# Patient Record
Sex: Male | Born: 1955 | Race: White | Hispanic: No | Marital: Married | Smoking: Never smoker
Health system: Southern US, Community
[De-identification: ages and names within clinical notes are randomized; demographics above are authoritative.]

## PROBLEM LIST (undated history)

## (undated) DIAGNOSIS — M5430 Sciatica, unspecified side: Secondary | ICD-10-CM

## (undated) DIAGNOSIS — E291 Testicular hypofunction: Secondary | ICD-10-CM

## (undated) DIAGNOSIS — U071 COVID-19: Secondary | ICD-10-CM

## (undated) DIAGNOSIS — R011 Cardiac murmur, unspecified: Secondary | ICD-10-CM

## (undated) DIAGNOSIS — Z6833 Body mass index (BMI) 33.0-33.9, adult: Secondary | ICD-10-CM

## (undated) DIAGNOSIS — F32A Depression, unspecified: Secondary | ICD-10-CM

## (undated) DIAGNOSIS — F419 Anxiety disorder, unspecified: Secondary | ICD-10-CM

## (undated) DIAGNOSIS — R0600 Dyspnea, unspecified: Secondary | ICD-10-CM

## (undated) DIAGNOSIS — J45909 Unspecified asthma, uncomplicated: Secondary | ICD-10-CM

## (undated) DIAGNOSIS — R0602 Shortness of breath: Secondary | ICD-10-CM

## (undated) DIAGNOSIS — E079 Disorder of thyroid, unspecified: Secondary | ICD-10-CM

## (undated) DIAGNOSIS — I10 Essential (primary) hypertension: Secondary | ICD-10-CM

## (undated) HISTORY — DX: Testicular hypofunction: E29.1

## (undated) HISTORY — DX: Sciatica, unspecified side: M54.30

## (undated) HISTORY — DX: Depression, unspecified: F32.A

## (undated) HISTORY — DX: Anxiety disorder, unspecified: F41.9

## (undated) HISTORY — DX: Essential (primary) hypertension: I10

## (undated) HISTORY — DX: COVID-19: U07.1

## (undated) HISTORY — DX: Dyspnea, unspecified: R06.00

## (undated) HISTORY — DX: Shortness of breath: R06.02

## (undated) HISTORY — DX: Cardiac murmur, unspecified: R01.1

## (undated) HISTORY — DX: Body mass index (BMI) 33.0-33.9, adult: Z68.33

---

## 2004-12-21 ENCOUNTER — Encounter: Admission: RE | Admit: 2004-12-21 | Discharge: 2004-12-21 | Payer: Self-pay | Admitting: Allergy and Immunology

## 2009-05-27 ENCOUNTER — Ambulatory Visit: Payer: Self-pay | Admitting: Diagnostic Radiology

## 2009-05-27 ENCOUNTER — Emergency Department (HOSPITAL_BASED_OUTPATIENT_CLINIC_OR_DEPARTMENT_OTHER): Admission: EM | Admit: 2009-05-27 | Discharge: 2009-05-27 | Payer: Self-pay | Admitting: Emergency Medicine

## 2017-07-21 ENCOUNTER — Other Ambulatory Visit (HOSPITAL_BASED_OUTPATIENT_CLINIC_OR_DEPARTMENT_OTHER): Payer: Self-pay | Admitting: Family Medicine

## 2017-07-21 DIAGNOSIS — M5430 Sciatica, unspecified side: Secondary | ICD-10-CM

## 2017-07-22 ENCOUNTER — Ambulatory Visit (HOSPITAL_BASED_OUTPATIENT_CLINIC_OR_DEPARTMENT_OTHER)
Admission: RE | Admit: 2017-07-22 | Discharge: 2017-07-22 | Disposition: A | Discharge status: Home / Self Care | Payer: 59 | Source: Ambulatory Visit | Attending: Family Medicine | Admitting: Family Medicine

## 2017-07-22 DIAGNOSIS — R937 Abnormal findings on diagnostic imaging of other parts of musculoskeletal system: Secondary | ICD-10-CM | POA: Insufficient documentation

## 2017-07-22 DIAGNOSIS — M5430 Sciatica, unspecified side: Secondary | ICD-10-CM | POA: Diagnosis present

## 2018-08-01 IMAGING — MR MR LUMBAR SPINE W/O CM
7 series · 46 of 48 positions shown · non-contrast
Comparison: None.

CLINICAL DATA: Low back pain for 3 years. RIGHT foot pain and
numbness for 1 month.

EXAM:
MRI LUMBAR SPINE WITHOUT CONTRAST
TECHNIQUE: Multiplanar, multisequence MR imaging of the lumbar spine was
performed. No intravenous contrast was administered.

[Series 2: T1 · sagittal · 4.0mm · 0.81mm/px · 5 of 16 slices shown (1 of 3)]
[im 1/16]
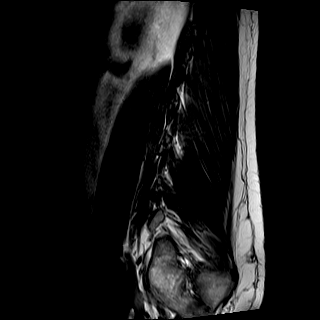
[im 4/16]
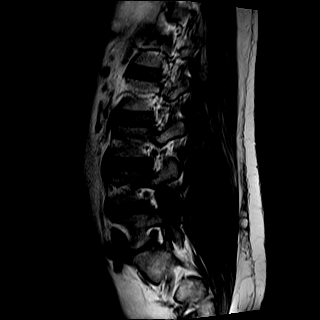
[im 8/16]
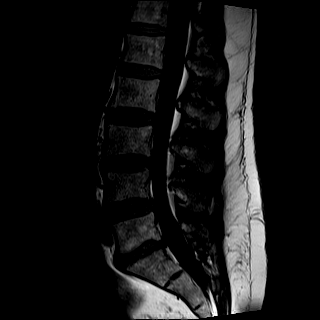
[im 12/16]
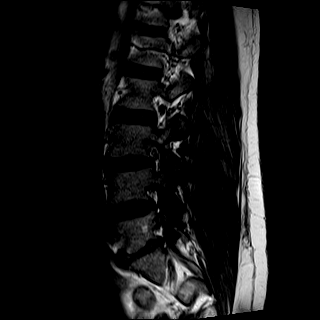
[im 16/16]
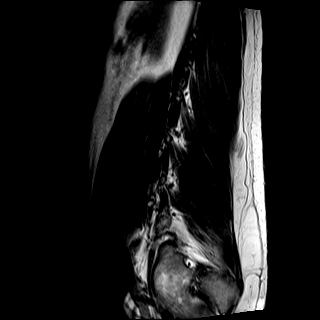

[Series 3: T2 · sagittal · 4.0mm · 0.81mm/px · 5 of 16 slices shown (1 of 3)]
[im 1/16]
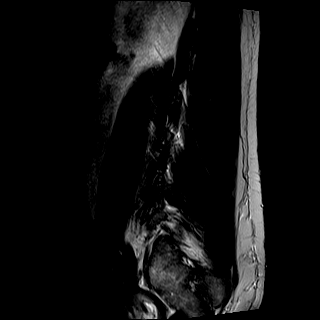
[im 4/16]
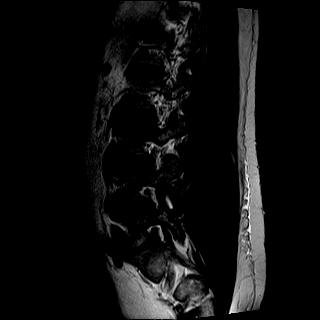
[im 8/16]
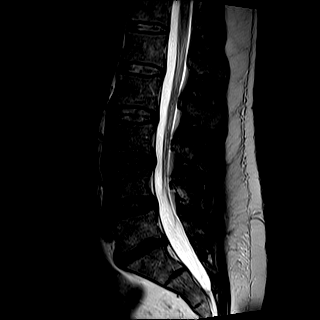
[im 12/16]
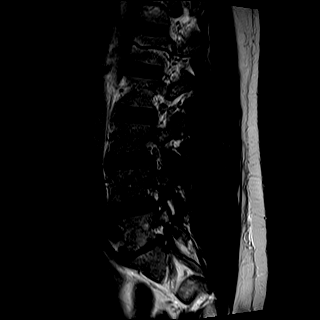
[im 16/16]
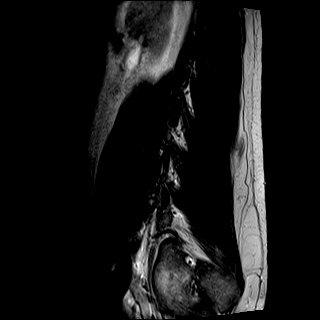

[Series 4: STIR · sagittal · 4.0mm · 0.81mm/px · 4 of 16 slices shown]
[im 1/16]
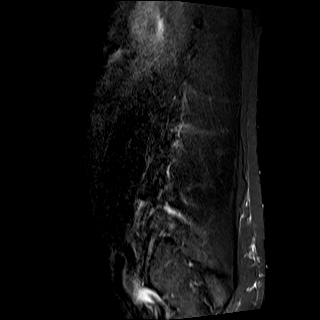
[im 4/16]
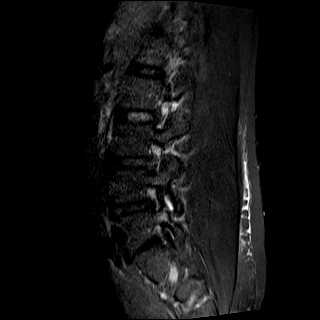
[im 7/16]
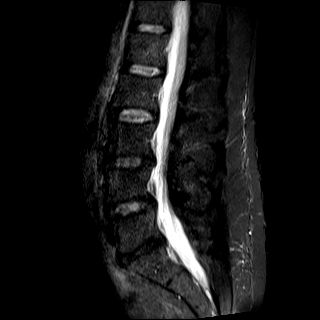
[im 10/16]
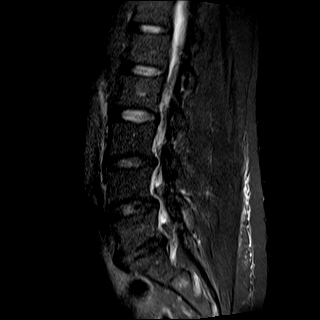

[Series 5: T2 · axial · 4.0mm · 0.62mm/px · z∈[+53,+157]mm · 8 of 22 slices shown (2 of 3)]
[im 1/22]
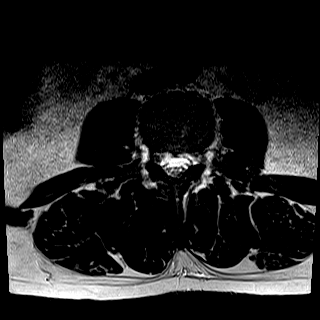
[im 4/22]
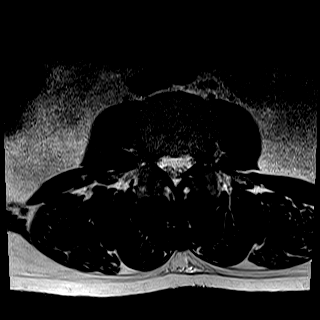
[im 7/22]
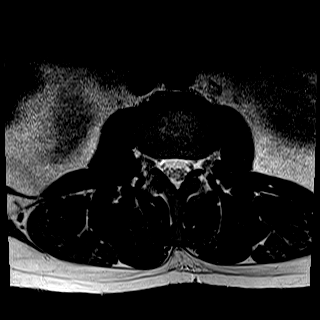
[im 10/22]
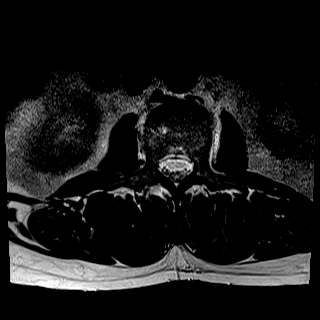
[im 13/22]
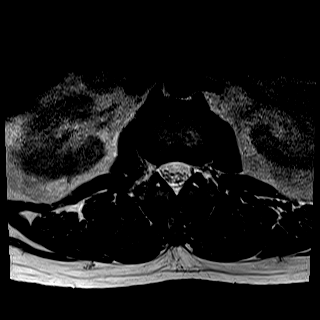
[im 16/22]
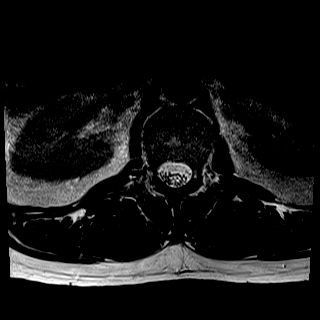
[im 19/22]
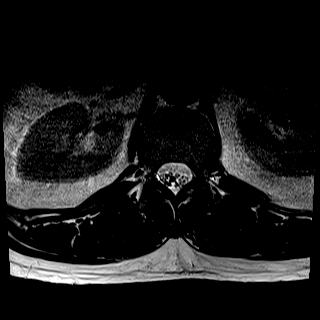
[im 22/22]
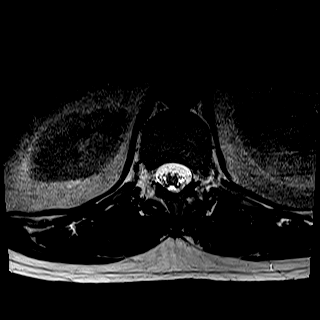

[Series 8: T1 · axial · 4.0mm · 0.78mm/px · z∈[-73,+30]mm · 8 of 22 slices shown (2 of 3)]
[im 1/22]
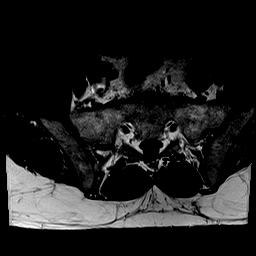
[im 4/22]
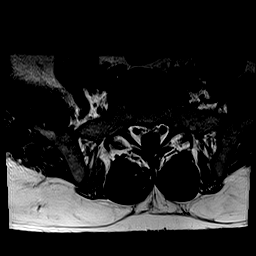
[im 7/22]
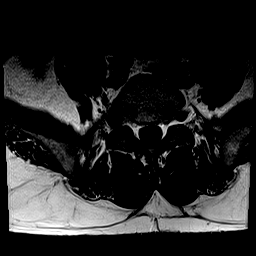
[im 10/22]
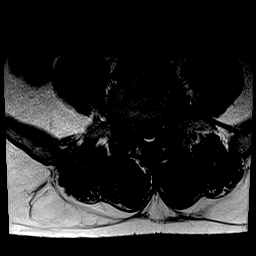
[im 13/22]
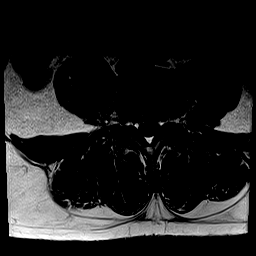
[im 16/22]
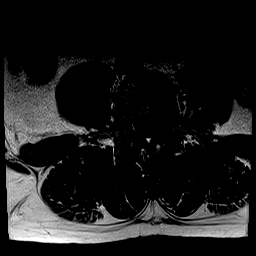
[im 19/22]
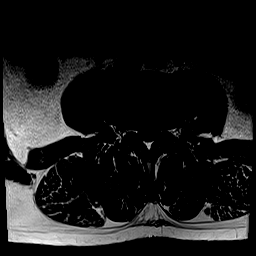
[im 22/22]
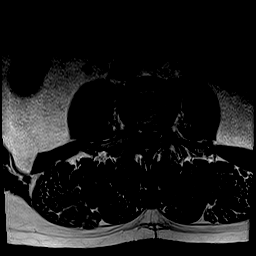

[Series 9: T2 · axial · 4.0mm · 0.62mm/px · z∈[-73,+30]mm · 8 of 22 slices shown (3 of 3)]
[im 1/22]
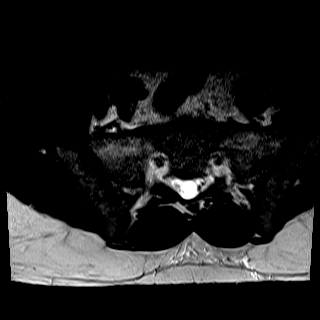
[im 4/22]
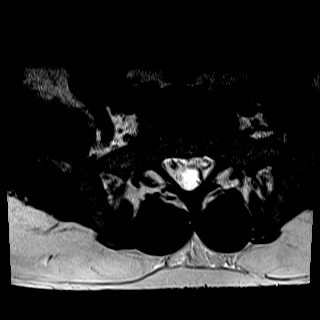
[im 7/22]
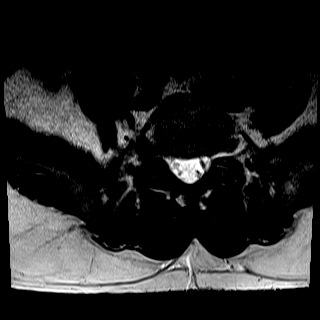
[im 10/22]
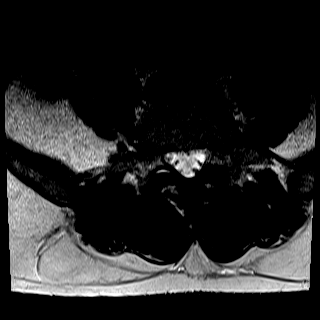
[im 13/22]
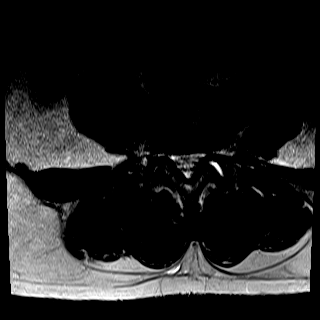
[im 16/22]
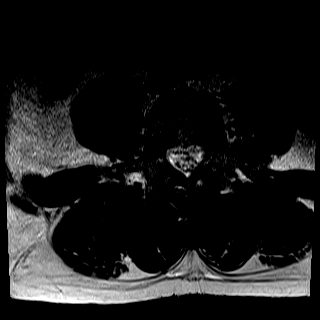
[im 19/22]
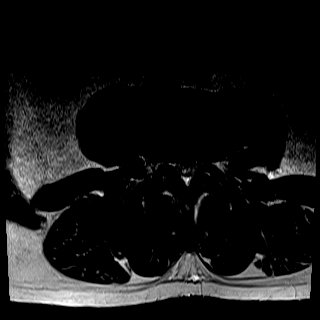
[im 22/22]
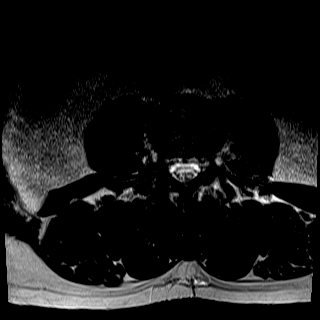

[Series 10: T1 · axial · 4.0mm · 0.62mm/px · z∈[+53,+157]mm · 8 of 22 slices shown (3 of 3)]
[im 1/22]
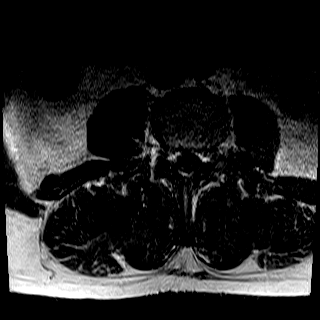
[im 4/22]
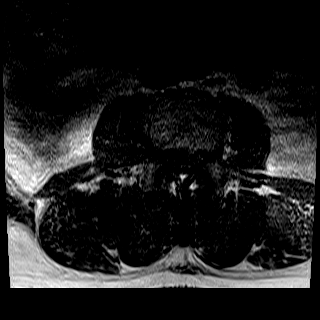
[im 7/22]
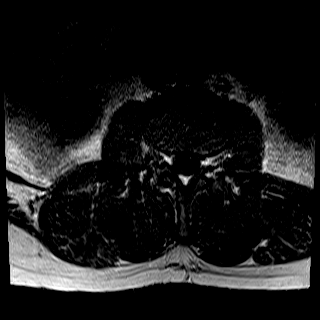
[im 10/22]
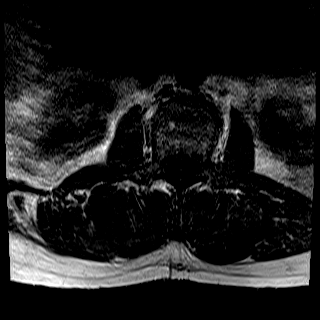
[im 13/22]
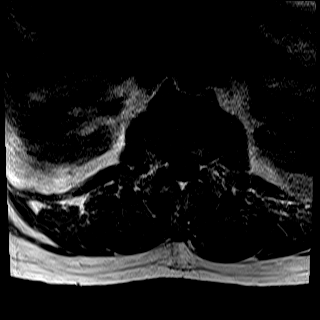
[im 16/22]
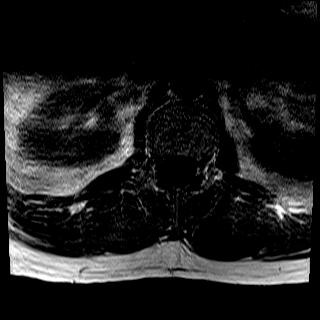
[im 19/22]
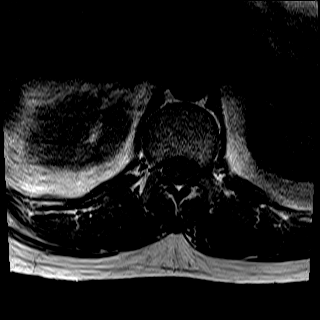
[im 22/22]
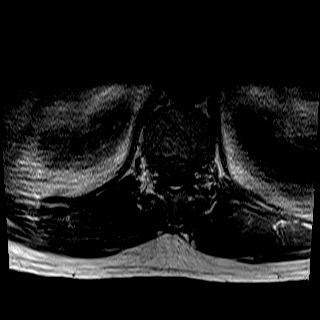

[46 of 48 positions shown; findings below may reference images not displayed]

FINDINGS: SEGMENTATION: For the purposes of this report, the last well-formed
intervertebral disc is reported as L5-S1.

ALIGNMENT: Maintained lumbar lordosis. Minimal grade 1 L4-5
anterolisthesis.

VERTEBRAE:Vertebral bodies are intact. Moderate L5-S1 disc height
loss. Mild desiccation L3-4 through L5-S1 discs. Mild chronic
discogenic endplate changes L5-S1, acute component toward the RIGHT.
Minimal chronic discogenic endplate changes L3-4 and L4-5.

CONUS MEDULLARIS AND CAUDA EQUINA: Conus medullaris terminates at
T12-L1 and demonstrates normal morphology and signal
characteristics. Cauda equina is normal.

PARASPINAL AND OTHER SOFT TISSUES: Included prevertebral and
paraspinal soft tissues are normal.

DISC LEVELS (moderately motion degraded axial sequences):

T12-L1 through L2-3: No disc bulge, canal stenosis nor neural
foraminal narrowing.

L3-4: Small broad-based disc bulge, mild facet arthropathy and
ligamentum flavum redundancy. No canal stenosis. Mild to moderate
RIGHT, mild LEFT neural foraminal narrowing.

L4-5: Anterolisthesis. Small broad-based disc bulge. Moderate facet
arthropathy with facet effusions which are likely reactive. No canal
stenosis. Mild to moderate bilateral neural foraminal narrowing.

L5-S1: Small broad-based disc bulge asymmetric to the RIGHT
extraforaminal low signal (axial 16/22). Mild facet arthropathy and
ligamentum flavum redundancy without canal stenosis. Moderate RIGHT,
mild LEFT neural foraminal narrowing.
IMPRESSION: 1. Motion degraded examination.
2. Minimal grade 1 L4-5 anterolisthesis without spondylolysis. No
fracture.
3. L5-S1 RIGHT extraforaminal disc protrusion/extrusion versus
partial volume main artifact.
4. No canal stenosis. Neural foraminal narrowing L3-4 through L5-S1:
Moderate on the RIGHT at L5-S1.

## 2020-06-17 ENCOUNTER — Emergency Department (HOSPITAL_BASED_OUTPATIENT_CLINIC_OR_DEPARTMENT_OTHER)
Admission: EM | Admit: 2020-06-17 | Discharge: 2020-06-17 | Disposition: A | Discharge status: Home / Self Care | Payer: 59 | Attending: Emergency Medicine | Admitting: Emergency Medicine

## 2020-06-17 ENCOUNTER — Encounter (HOSPITAL_BASED_OUTPATIENT_CLINIC_OR_DEPARTMENT_OTHER): Payer: Self-pay | Admitting: *Deleted

## 2020-06-17 ENCOUNTER — Other Ambulatory Visit: Payer: Self-pay

## 2020-06-17 DIAGNOSIS — U071 COVID-19: Secondary | ICD-10-CM | POA: Insufficient documentation

## 2020-06-17 DIAGNOSIS — R0602 Shortness of breath: Secondary | ICD-10-CM | POA: Diagnosis present

## 2020-06-17 DIAGNOSIS — R5381 Other malaise: Secondary | ICD-10-CM | POA: Diagnosis not present

## 2020-06-17 DIAGNOSIS — J45909 Unspecified asthma, uncomplicated: Secondary | ICD-10-CM | POA: Diagnosis not present

## 2020-06-17 DIAGNOSIS — R410 Disorientation, unspecified: Secondary | ICD-10-CM | POA: Insufficient documentation

## 2020-06-17 DIAGNOSIS — R531 Weakness: Secondary | ICD-10-CM | POA: Diagnosis not present

## 2020-06-17 DIAGNOSIS — R5383 Other fatigue: Secondary | ICD-10-CM

## 2020-06-17 DIAGNOSIS — R0600 Dyspnea, unspecified: Secondary | ICD-10-CM | POA: Insufficient documentation

## 2020-06-17 HISTORY — DX: Disorder of thyroid, unspecified: E07.9

## 2020-06-17 HISTORY — DX: Unspecified asthma, uncomplicated: J45.909

## 2020-06-17 LAB — BASIC METABOLIC PANEL
Anion gap: 8 (ref 5–15)
BUN: 13 mg/dL (ref 8–23)
CO2: 27 mmol/L (ref 22–32)
Calcium: 8.2 mg/dL — ABNORMAL LOW (ref 8.9–10.3)
Chloride: 102 mmol/L (ref 98–111)
Creatinine, Ser: 0.84 mg/dL (ref 0.61–1.24)
GFR, Estimated: >60 mL/min (ref >60)
Glucose, Bld: 106 mg/dL — ABNORMAL HIGH (ref 70–99)
Potassium: 3.9 mmol/L (ref 3.5–5.1)
Sodium: 137 mmol/L (ref 135–145)

## 2020-06-17 LAB — CBC WITH DIFFERENTIAL/PLATELET
Abs Immature Granulocytes: 0.06 10*3/uL (ref 0.00–0.07)
Basophils Absolute: 0 10*3/uL (ref 0.0–0.1)
Basophils Relative: 0 %
Eosinophils Absolute: 0 10*3/uL (ref 0.0–0.5)
Eosinophils Relative: 0 %
HCT: 45.1 % (ref 39.0–52.0)
Hemoglobin: 15.1 g/dL (ref 13.0–17.0)
Immature Granulocytes: 1 %
Lymphocytes Relative: 9 %
Lymphs Abs: 0.6 10*3/uL — ABNORMAL LOW (ref 0.7–4.0)
MCH: 31.6 pg (ref 26.0–34.0)
MCHC: 33.5 g/dL (ref 30.0–36.0)
MCV: 94.4 fL (ref 80.0–100.0)
Monocytes Absolute: 0.7 10*3/uL (ref 0.1–1.0)
Monocytes Relative: 10 %
Neutro Abs: 5.7 10*3/uL (ref 1.7–7.7)
Neutrophils Relative %: 80 %
Platelets: 238 10*3/uL (ref 150–400)
RBC: 4.78 MIL/uL (ref 4.22–5.81)
RDW: 12.5 % (ref 11.5–15.5)
WBC: 7.2 10*3/uL (ref 4.0–10.5)
nRBC: 0 % (ref 0.0–0.2)

## 2020-06-17 MED ORDER — SODIUM CHLORIDE 0.9 % IV BOLUS
500.0000 mL | Freq: Once | INTRAVENOUS | Status: AC
  Administered 2020-06-17: 500 mL via INTRAVENOUS

## 2020-06-17 NOTE — ED Provider Notes (Signed)
[facility] EMERGENCY DEPARTMENT Provider Note   CSN: [id number] Arrival date & time: 06/17/20  1026     History Chief Complaint  Patient presents with  . Covid+, SOB    Bobby Morrow is a 64 y.o. male.  Patient presents ER chief complaint of persistent shortness of breath with some generalized malaise and weakness, persistent episodes of confusion which he describes as losing his train of thought.  He was diagnosed with Covid about 11 days ago.  He states that his cough has improved and is not having any fevers but is concerned that he still having symptoms.  Denies any pain at this time but complaining of generalized weakness and fatigue.       Past Medical History:  Diagnosis Date  . Asthma   . Thyroid disease     There are no problems to display for this patient.   History reviewed. No pertinent surgical history.     History reviewed. No pertinent family history.  Social History   Tobacco Use  . Smoking status: Never Smoker  . Smokeless tobacco: Never Used  Substance Use Topics  . Alcohol use: Yes    Comment: occasionally  . Drug use: Never    Home Medications Prior to Admission medications   Not on File    Allergies    Ciprofloxacin and Sulfa antibiotics  Review of Systems   Review of Systems  Constitutional: Negative for fever.  HENT: Negative for ear pain and sore throat.   Eyes: Negative for pain.  Respiratory: Positive for shortness of breath. Negative for cough.   Cardiovascular: Negative for chest pain.  Gastrointestinal: Negative for abdominal pain.  Genitourinary: Negative for flank pain.  Musculoskeletal: Negative for back pain.  Skin: Negative for color change and rash.  Neurological: Negative for syncope.  All other systems reviewed and are negative.   Physical Exam Updated Vital Signs BP (!) 157/115   Pulse 95   Temp 98.2 F (36.8 C) (Oral)   Resp (!) 23   Ht 6\' 2"  (1.88 m)   Wt 103 kg   SpO2 93%   BMI 29.15  kg/m   Physical Exam Constitutional:      General: He is not in acute distress.    Appearance: He is well-developed.  HENT:     Head: Normocephalic.     Mouth/Throat:     Mouth: Mucous membranes are moist.  Cardiovascular:     Rate and Rhythm: Normal rate.  Pulmonary:     Effort: Pulmonary effort is normal.  Abdominal:     Palpations: Abdomen is soft.  Musculoskeletal:     Right lower leg: No edema.     Left lower leg: No edema.  Skin:    General: Skin is warm.     Capillary Refill: Capillary refill takes less than 2 seconds.  Neurological:     General: No focal deficit present.     Mental Status: He is alert.     ED Results / Procedures / Treatments   Labs (all labs ordered are listed, but only abnormal results are displayed) Labs Reviewed  BASIC METABOLIC PANEL - Abnormal; Notable for the following components:      Result Value   Glucose, Bld 106 (*)    Calcium 8.2 (*)    All other components within normal limits  CBC WITH DIFFERENTIAL/PLATELET - Abnormal; Notable for the following components:   Lymphs Abs 0.6 (*)    All other components within normal limits  EKG None  Radiology No results found.  Procedures Procedures (including critical care time)  Medications Ordered in ED Medications  sodium chloride 0.9 % bolus 500 mL (500 mLs Intravenous New Bag/Given 06/17/20 1148)    ED Course  I have reviewed the triage vital signs and the nursing notes.  Pertinent labs & imaging results that were available during my care of the patient were reviewed by me and considered in my medical decision making (see chart for details).    MDM Rules/Calculators/A&P                          Presents with persistent Covid symptoms despite nearing the end of his Covid infection.  States that he still gets episodes where he loses train of thought any brain feels confused at times.  Labs are sent and chemistry appears normal vital signs within normal limits.  O2  saturation 97% on room air which is appropriate.  Patient advised that sometimes certain Covid symptoms can linger for weeks to months.  Recommended close outpatient follow-up and monitoring with his primary care doctor within the next 2 to 3 days.  I advised immediate return if he has worsening symptoms pain.   Final Clinical Impression(s) / ED Diagnoses Final diagnoses:  Fatigue, unspecified type  Dyspnea, unspecified type    Rx / DC Orders ED Discharge Orders    None       Cheryll Cockayne, MD 06/17/20 1317

## 2020-06-17 NOTE — ED Triage Notes (Signed)
Tested Covid + on the 28th of November and still has shortness of breath.  Pt stated that he has some confusion since he had the covid.

## 2020-06-17 NOTE — Discharge Instructions (Addendum)
Call your primary care doctor or specialist as discussed in the next 2-3 days.   Return immediately back to the ER if:  Your symptoms worsen within the next 12-24 hours. You develop new symptoms such as new fevers, persistent vomiting, new pain, shortness of breath, or new weakness or numbness, or if you have any other concerns.  

## 2020-06-17 NOTE — ED Notes (Signed)
Pt on monitor and vitals cycling 

## 2022-05-08 NOTE — Progress Notes (Unsigned)
Cardiology Office Note:    Date:  05/08/2022   ID:  Bobby Morrow, DOB 1955-07-29, MRN [id number]  PCP:  System, Provider Not In   [place] Providers Cardiologist:  None {    Referring MD: Ramiro Harvest, PA-C    History of Present Illness:    Bobby Morrow is a 66 y.o. male with a hx of anxiety, depression, HTN and asthma who was referred by Ammie Dalton, PA-C for further evaluation of dyspnea on exertion.  Patient was seen on 04/12/22 by Ammie Dalton. Note reviewed. Patient was having dyspnea on exertion at that visit. TTE per their report showed EF 55-60%, no significant valve disease, G1DD. Given symptoms, he was referred to Cardiology for further evaluation.  Today, ***  Past Medical History:  Diagnosis Date   Anxiety    Asthma    BMI 33.0-33.9,adult    COVID    Depression    Dyspnea    Heart murmur    Hypertension    Sciatic pain    SOB (shortness of breath)    Testosterone deficiency in male    Thyroid disease     No past surgical history on file.  Current Medications: No outpatient medications have been marked as taking for the 05/10/22 encounter (Appointment) with Freada Bergeron, MD.     Allergies:   Ciprofloxacin, Amlodipine, and Sulfa antibiotics   Social History   Socioeconomic History   Marital status: Married    Spouse name: Not on file   Number of children: Not on file   Years of education: Not on file   Highest education level: Not on file  Occupational History   Not on file  Tobacco Use   Smoking status: Never   Smokeless tobacco: Never  Substance and Sexual Activity   Alcohol use: Yes    Comment: occasionally   Drug use: Never   Sexual activity: Not Currently  Other Topics Concern   Not on file  Social History Narrative   Not on file   Social Determinants of Health   Financial Resource Strain: Not on file  Food Insecurity: Not on file  Transportation Needs: Not on file  Physical Activity: Not on  file  Stress: Not on file  Social Connections: Not on file     Family History: The patient's ***family history is not on file.  ROS:   Please see the history of present illness.    *** All other systems reviewed and are negative.  EKGs/Labs/Other Studies Reviewed:    The following studies were reviewed today: ***  EKG:  EKG is *** ordered today.  The ekg ordered today demonstrates ***  Recent Labs: No results found for requested labs within last 365 days.  Recent Lipid Panel No results found for: "CHOL", "TRIG", "HDL", "CHOLHDL", "VLDL", "LDLCALC", "LDLDIRECT"   Risk Assessment/Calculations:   {Does this patient have ATRIAL FIBRILLATION?[phone]}  No BP recorded.  {Refresh Note OR Click here to enter BP  :1}***         Physical Exam:    VS:  There were no vitals taken for this visit.    Wt Readings from Last 3 Encounters:  06/17/20 227 lb (103 kg)     GEN: *** Well nourished, well developed in no acute distress HEENT: Normal NECK: No JVD; No carotid bruits LYMPHATICS: No lymphadenopathy CARDIAC: ***RRR, no murmurs, rubs, gallops RESPIRATORY:  Clear to auscultation without rales, wheezing or rhonchi  ABDOMEN: Soft, non-tender, non-distended MUSCULOSKELETAL:  No  edema; No deformity  SKIN: Warm and dry NEUROLOGIC:  Alert and oriented x 3 PSYCHIATRIC:  Normal affect   ASSESSMENT:    No diagnosis found. PLAN:    In order of problems listed above:  #Dyspnea on Exertion: TTE with LVEF 55-60%, no significant valve disease, G1DD. Currently, *** -Check coronary CTA  #HTN: -Continue losartan 100mg  daily      {Are you ordering a CV Procedure (e.g. stress test, cath, DCCV, TEE, etc)?   Press F2        :    Medication Adjustments/Labs and Tests Ordered: Current medicines are reviewed at length with the patient today.  Concerns regarding medicines are outlined above.  No orders of the defined types were placed in this encounter.  No orders of  the defined types were placed in this encounter.   There are no Patient Instructions on file for this visit.   Signed, [id number]}, MD  05/08/2022 8:24 PM    [facility]

## 2022-05-10 ENCOUNTER — Ambulatory Visit: Payer: 59 | Attending: Cardiology | Admitting: Cardiology

## 2022-05-10 ENCOUNTER — Encounter: Payer: Self-pay | Admitting: Cardiology

## 2022-05-10 VITALS — BP 142/99 | HR 83 | Ht 74.0 in | Wt 233.0 lb

## 2022-05-10 DIAGNOSIS — R072 Precordial pain: Secondary | ICD-10-CM

## 2022-05-10 DIAGNOSIS — R0609 Other forms of dyspnea: Secondary | ICD-10-CM | POA: Diagnosis not present

## 2022-05-10 DIAGNOSIS — I10 Essential (primary) hypertension: Secondary | ICD-10-CM

## 2022-05-10 MED ORDER — VALSARTAN 160 MG PO TABS: 160.0000 mg | ORAL_TABLET | Freq: Every day | ORAL | 2 refills | Status: DC

## 2022-05-10 MED ORDER — METOPROLOL TARTRATE 100 MG PO TABS: 100.0000 mg | ORAL_TABLET | Freq: Once | ORAL | 0 refills | Status: DC

## 2022-05-10 NOTE — Patient Instructions (Signed)
Medication Instructions:   STOP LOSARTAN NOW  START TAKING VALSARTAN 160 MG BY MOUTH DAILY  *If you need a refill on your cardiac medications before your next appointment, please call your pharmacy*   Lab Work:  ONE WEEK HERE IN THE OFFICE--BMET  If you have labs (blood work) drawn today and your tests are completely normal, you will receive your results only by: MyChart Message (if you have MyChart) OR A paper copy in the mail If you have any lab test that is abnormal or we need to change your treatment, we will call you to review the results.   Testing/Procedures:    Your cardiac CT will be scheduled at one of the below locations:   [facility] [address] [place], [place] [address] [phone]   If scheduled at [facility], please arrive at the [facility] (Entrance C2) of [facility] 30 minutes prior to test start time. You can use the FREE valet parking offered at entrance C (encouraged to control the heart rate for the test)  Proceed to the [facility] Radiology Department (first floor) to check-in and test prep.  All radiology patients and guests should use entrance C2 at [facility], accessed from [facility], even though the hospital's physical address listed is [address]     Please follow these instructions carefully (unless otherwise directed):  Hold all erectile dysfunction medications at least 3 days (72 hrs) prior to test. (Ie viagra, cialis, sildenafil, tadalafil, etc) We will administer nitroglycerin during this exam.   On the Night Before the Test: Be sure to Drink plenty of water. Do not consume any caffeinated/decaffeinated beverages or chocolate 12 hours prior to your test. Do not take any antihistamines 12 hours prior to your test.   On the Day of the Test: Drink plenty of water until 1 hour prior to the test. Do not eat any food 1 hour prior to test. You  may take your regular medications prior to the test.  Take metoprolol 100 MG BY MOUTH  (Lopressor) two hours prior to test.       After the Test: Drink plenty of water. After receiving IV contrast, you may experience a mild flushed feeling. This is normal. On occasion, you may experience a mild rash up to 24 hours after the test. This is not dangerous. If this occurs, you can take Benadryl 25 mg and increase your fluid intake. If you experience trouble breathing, this can be serious. If it is severe call 911 IMMEDIATELY. If it is mild, please call our office.   We will call to schedule your test 2-4 weeks out understanding that some insurance companies will need an authorization prior to the service being performed.   For non-scheduling related questions, please contact the cardiac imaging nurse navigator should you have any questions/concerns: Rockwell Alexandria, Cardiac Imaging Nurse Navigator Larey Brick, Cardiac Imaging Nurse Navigator [facility] Direct Office Dial: [phone]   For scheduling needs, including cancellations and rescheduling, please call [place], [phone].    Follow-Up: At [facility], you and your health needs are our priority.  As part of our continuing mission to provide you with exceptional heart care, we have created designated Provider Care Teams.  These Care Teams include your primary Cardiologist (physician) and Advanced Practice Providers (APPs -  Physician Assistants and Nurse Practitioners) who all work together to provide you with the care you need, when  you need it.  We recommend signing up for the patient portal called "MyChart".  Sign up information is provided on this After Visit Summary.  MyChart is used to connect with patients for Virtual Visits (Telemedicine).  Patients are able to view lab/test results, encounter notes, upcoming appointments, etc.  Non-urgent messages can be sent to your provider as well.    To learn more about what you can do with MyChart, go to [email or web address].    Your next appointment:   1 year(s)  The format for your next appointment:   In Person  Provider:   DR. Johney Frame   Important Information About Sugar

## 2022-05-10 NOTE — Progress Notes (Signed)
Cardiology Office Note:    Date:  05/10/2022   ID:  Bobby Morrow, DOB [birth year], MRN [id number]  PCP:  System, Provider Not In   [facility] Providers Cardiologist:  None {    Referring MD: Sheliah Hatch, PA-C    History of Present Illness:    Bobby Morrow is a 66 y.o. male with a hx of anxiety, depression, HTN and asthma who was referred by Tera Helper, PA-C for further evaluation of dyspnea on exertion.  Patient was seen on 04/12/22 by Tera Helper. Note reviewed. Patient was having dyspnea on exertion at that visit. TTE per their report showed EF 55-60%, no significant valve disease, G1DD. Given symptoms, he was referred to Cardiology for further evaluation.   Today, the patient states that he has been feeling ok. He reports new onset shortness of breath. He walks to the mailbox and back around 75 feet and has to sit down and rest right after. This is new to him and started a few months ago, but has been worsening recently. He doesn't notice any related symptoms such as palpitations or chest pain.  His blood pressure at home has been around 130-140s and he states that is has been fluctuating with his caffeine consumption and activity. He is rarely in the 120's. His blood pressure in clinic was measured at 142/99.  He rides his exercise bike every day for 20 minutes. In the past he was able to go for an hour or more. Recently he hasn't been able to bike longer than 20 minutes and has to rest for a long time after.  He used to drink 6 diet mountain dews a day and now instead he takes caffeine mints which limit him to 150 mg of caffeine a day. He states that without caffeine he feels fatigued.  He reported lightheadedness and dizziness when he used amlodipine and doesn't think he can tolerate the medication.  He has been compliant with his losartan and hasn't noticed any lowering potassium.  He doesn't smoke or drink  He doesn't know if anyone in his family  has heart disease  He denies any palpitations, chest pain, or peripheral edema. No lightheadedness, headaches, syncope, orthopnea, or PND.  Past Medical History:  Diagnosis Date   Anxiety    Asthma    BMI 33.0-33.9,adult    COVID    Depression    Dyspnea    Heart murmur    Hypertension    Sciatic pain    SOB (shortness of breath)    Testosterone deficiency in male    Thyroid disease     No past surgical history on file.  Current Medications: Current Meds  Medication Sig   Ascorbic Acid (VITAMIN C) 1000 MG tablet Take 1,000 mg by mouth daily.   Cholecalciferol (D3 PO) Take 20,000 Units by mouth daily.   Iodine Strong, Lugols, (LUGOLS PO) Take 9 drops by mouth daily.   levothyroxine (EUTHYROX) 137 MCG tablet Take 137 mcg by mouth daily before breakfast.   metoprolol tartrate (LOPRESSOR) 100 MG tablet Take 1 tablet (100 mg total) by mouth once for 1 dose. Take 90-120 minutes prior to scan.   Multiple Vitamin (MULTIVITAMIN) tablet Take 1 tablet by mouth daily.   UNABLE TO FIND beet root, 1 tsp daily   valsartan (DIOVAN) 160 MG tablet Take 1 tablet (160 mg total) by mouth daily.   [DISCONTINUED] losartan (COZAAR) 100 MG tablet Take 100 mg by mouth daily.  Allergies:   Ciprofloxacin, Amlodipine, and Sulfa antibiotics   Social History   Socioeconomic History   Marital status: Married    Spouse name: Not on file   Number of children: Not on file   Years of education: Not on file   Highest education level: Not on file  Occupational History   Not on file  Tobacco Use   Smoking status: Never   Smokeless tobacco: Never  Substance and Sexual Activity   Alcohol use: Yes    Comment: occasionally   Drug use: Never   Sexual activity: Not Currently  Other Topics Concern   Not on file  Social History Narrative   Not on file   Social Determinants of Health   Financial Resource Strain: Not on file  Food Insecurity: Not on file  Transportation Needs: Not on file   Physical Activity: Not on file  Stress: Not on file  Social Connections: Not on file     Family History: The patient's family history is not on file.  ROS:   Please see the history of present illness.    Review of Systems  Constitutional:  Negative for chills and fever.  HENT:  Negative for nosebleeds and tinnitus.   Eyes:  Negative for blurred vision and pain.  Respiratory:  Positive for shortness of breath. Negative for cough, hemoptysis and stridor.   Cardiovascular:  Negative for chest pain, palpitations, orthopnea, claudication, leg swelling and PND.  Gastrointestinal:  Negative for blood in stool, diarrhea, nausea and vomiting.  Genitourinary:  Negative for dysuria and hematuria.  Musculoskeletal:  Negative for falls.  Neurological:  Negative for dizziness, loss of consciousness and headaches.  Psychiatric/Behavioral:  Negative for depression, hallucinations and substance abuse. The patient does not have insomnia.      All other systems reviewed and are negative.  EKGs/Labs/Other Studies Reviewed:    The following studies were reviewed today: No CV studies  EKG:  EKG is personally reviewed 05/10/22: NSR HR 83  Recent Labs: No results found for requested labs within last 365 days.  Recent Lipid Panel No results found for: "CHOL", "TRIG", "HDL", "CHOLHDL", "VLDL", "LDLCALC", "LDLDIRECT"   Risk Assessment/Calculations:      HYPERTENSION CONTROL Vitals:   05/10/22 1532 05/10/22 1607  BP: (!) 142/90 (!) 142/99    The patient's blood pressure is elevated above target today.  In order to address the patient's elevated BP: A new medication was prescribed today.            Physical Exam:    VS:  BP (!) 142/99 (BP Location: Right Arm, Patient Position: Sitting, Cuff Size: Normal)   Pulse 83   Ht 6\' 2"  (1.88 m)   Wt 233 lb (105.7 kg)   SpO2 98%   BMI 29.92 kg/m     Wt Readings from Last 3 Encounters:  05/10/22 233 lb (105.7 kg)  06/17/20 227 lb (103  kg)     GEN:  Well nourished, well developed in no acute distress HEENT: Normal NECK: No JVD; No carotid bruits CARDIAC: RRR, 1/6 murmur,No rubs or gallops RESPIRATORY:  Clear to auscultation without rales, wheezing or rhonchi  ABDOMEN: Soft, non-tender, non-distended MUSCULOSKELETAL:  No edema; No deformity  SKIN: Warm and dry NEUROLOGIC:  Alert and oriented x 3 PSYCHIATRIC:  Normal affect   ASSESSMENT:    1. Precordial pain   2. Primary hypertension   3. Dyspnea on exertion    PLAN:    In order of problems listed above:  #  Dyspnea on Exertion: Patient presents with worsening dyspnea and fatigue on exertion that has been progressing over the past several months. Used to be able to ride his stationary bike for 1 hour but now can only make it before needing to rest. TTE with LVEF 55-60%, no significant valve disease, G1DD per report (done at PCP office). Given symptoms and risk factors, will check coronary CTA for further evaluation.  -Check coronary CTA  #HTN: Elevated today and mainly 130-140s at home. If goes up with exercise, may be contributing to symptoms. Will change losartan to valsartan and monitor.  -Change losartan 100mg  to valsartan 160mg  daily -Check BMET -Encouraged cutting back on caffeine as well         Follow Up: 1 year PRN according to cardiac CTA  Medication Adjustments/Labs and Tests Ordered: Current medicines are reviewed at length with the patient today.  Concerns regarding medicines are outlined above.  Orders Placed This Encounter  Procedures   CT CORONARY MORPH W/CTA COR W/SCORE W/CA W/CM &/OR WO/CM   Basic metabolic panel   EKG 12-Lead   Meds ordered this encounter  Medications   metoprolol tartrate (LOPRESSOR) 100 MG tablet    Sig: Take 1 tablet (100 mg total) by mouth once for 1 dose. Take 90-120 minutes prior to scan.    Dispense:  1 tablet    Refill:  0   valsartan (DIOVAN) 160 MG tablet    Sig: Take 1 tablet (160 mg total) by  mouth daily.    Dispense:  90 tablet    Refill:  2   Patient Instructions  Medication Instructions:   STOP LOSARTAN NOW  START TAKING VALSARTAN 160 MG BY MOUTH DAILY  *If you need a refill on your cardiac medications before your next appointment, please call your pharmacy*   Lab Work:  ONE WEEK HERE IN THE OFFICE--BMET  If you have labs (blood work) drawn today and your tests are completely normal, you will receive your results only by: MyChart Message (if you have MyChart) OR A paper copy in the mail If you have any lab test that is abnormal or we need to change your treatment, we will call you to review the results.   Testing/Procedures:    Your cardiac CT will be scheduled at one of the below locations:   [facility] [address] [place], [address] [place] [phone]   If scheduled at [facility], please arrive at the [facility] and Children's Entrance (Entrance C2) of [facility] 30 minutes prior to test start time. You can use the FREE valet parking offered at entrance C (encouraged to control the heart rate for the test)  Proceed to the [facility] Radiology Department (first floor) to check-in and test prep.  All radiology patients and guests should use entrance C2 at [facility], accessed from [facility], even though the hospital's physical address listed is [address]     Please follow these instructions carefully (unless otherwise directed):  Hold all erectile dysfunction medications at least 3 days (72 hrs) prior to test. (Ie viagra, cialis, sildenafil, tadalafil, etc) We will administer nitroglycerin during this exam.   On the Night Before the Test: Be sure to Drink plenty of water. Do not consume any caffeinated/decaffeinated beverages or chocolate 12 hours prior to your test. Do not take any antihistamines 12 hours prior to your test.   On the Day of the Test: Drink plenty of water  until 1 hour prior to the test. Do not eat any food 1 hour prior to test. You may take your regular medications prior to the test.  Take metoprolol 100 MG BY MOUTH  (Lopressor) two hours prior to test.       After the Test: Drink plenty of water. After receiving IV contrast, you may experience a mild flushed feeling. This is normal. On occasion, you may experience a mild rash up to 24 hours after the test. This is not dangerous. If this occurs, you can take Benadryl 25 mg and increase your fluid intake. If you experience trouble breathing, this can be serious. If it is severe call 911 IMMEDIATELY. If it is mild, please call our office.   We will call to schedule your test 2-4 weeks out understanding that some insurance companies will need an authorization prior to the service being performed.   For non-scheduling related questions, please contact the cardiac imaging nurse navigator should you have any questions/concerns: Rockwell Alexandria, Cardiac Imaging Nurse Navigator Larey Brick, Cardiac Imaging Nurse Navigator  Heart and Vascular Services Direct Office Dial: [phone]   For scheduling needs, including cancellations and rescheduling, please call [place], [phone].    Follow-Up: At [facility], you and your health needs are our priority.  As part of our continuing mission to provide you with exceptional heart care, we have created designated Provider Care Teams.  These Care Teams include your primary Cardiologist (physician) and Advanced Practice Providers (APPs -  Physician Assistants and Nurse Practitioners) who all work together to provide you with the care you need, when you need it.  We recommend signing up for the patient portal called "MyChart".  Sign up information is provided on this After Visit Summary.  MyChart is used to connect with patients for Virtual Visits (Telemedicine).  Patients are able to view lab/test results, encounter notes, upcoming  appointments, etc.  Non-urgent messages can be sent to your provider as well.   To learn more about what you can do with MyChart, go to [email or web address].    Your next appointment:   1 year(s)  The format for your next appointment:   In Person  Provider:   DR. Shari Prows   Important Information About Sugar         I,Coren O'Brien,acting as a scribe for Meriam Sprague, MD.,have documented all relevant documentation on the behalf of Meriam Sprague, MD,as directed by  Meriam Sprague, MD while in the presence of Meriam Sprague, MD.  I, Meriam Sprague, MD, have reviewed all documentation for this visit. The documentation on 05/10/22 for the exam, diagnosis, procedures, and orders are all accurate and complete.   Signed, Meriam Sprague, MD  05/10/2022 5:01 PM    [facility]

## 2022-05-11 ENCOUNTER — Telehealth: Payer: Self-pay | Admitting: *Deleted

## 2022-05-11 NOTE — Telephone Encounter (Signed)
-----   Message from Melony Overly sent at 05/11/2022  1:09 PM EDT ----- Regarding: ct heart Scheduled 11/15 at 12:00

## 2022-05-17 ENCOUNTER — Ambulatory Visit: Payer: 59 | Attending: Cardiology

## 2022-05-17 DIAGNOSIS — R072 Precordial pain: Secondary | ICD-10-CM

## 2022-05-18 LAB — BASIC METABOLIC PANEL
BUN/Creatinine Ratio: 17 (ref 10–24)
BUN: 17 mg/dL (ref 8–27)
CO2: 26 mmol/L (ref 20–29)
Calcium: 9.6 mg/dL (ref 8.6–10.2)
Chloride: 100 mmol/L (ref 96–106)
Creatinine, Ser: 1.02 mg/dL (ref 0.76–1.27)
Glucose: 97 mg/dL (ref 70–99)
Potassium: 4 mmol/L (ref 3.5–5.2)
Sodium: 139 mmol/L (ref 134–144)
eGFR: 82 mL/min/{1.73_m2} (ref >59)

## 2022-05-24 ENCOUNTER — Telehealth (HOSPITAL_COMMUNITY): Payer: Self-pay | Admitting: *Deleted

## 2022-05-24 NOTE — Telephone Encounter (Signed)
Reaching out to patient to offer assistance regarding upcoming cardiac imaging study; pt verbalizes understanding of appt date/time, parking situation and where to check in, medications ordered, and verified current allergies; name and call back number provided for further questions should they arise  Anaja Monts RN Navigator Cardiac Imaging [facility] [phone] office [phone] cell  Patient to take 100mg metoprolol tartrate two hours prior to his cardiac CT scan. He is aware to arrive at 11:30am.  

## 2022-05-25 ENCOUNTER — Other Ambulatory Visit: Payer: Self-pay | Admitting: Cardiovascular Disease

## 2022-05-25 ENCOUNTER — Ambulatory Visit (HOSPITAL_COMMUNITY)
Admission: RE | Admit: 2022-05-25 | Discharge: 2022-05-25 | Disposition: A | Discharge status: Home / Self Care | Payer: 59 | Source: Ambulatory Visit | Attending: Cardiology | Admitting: Cardiology

## 2022-05-25 ENCOUNTER — Ambulatory Visit (HOSPITAL_BASED_OUTPATIENT_CLINIC_OR_DEPARTMENT_OTHER)
Admission: RE | Admit: 2022-05-25 | Discharge: 2022-05-25 | Disposition: A | Discharge status: Home / Self Care | Payer: 59 | Source: Ambulatory Visit | Attending: Cardiovascular Disease | Admitting: Cardiovascular Disease

## 2022-05-25 DIAGNOSIS — R072 Precordial pain: Secondary | ICD-10-CM

## 2022-05-25 DIAGNOSIS — R931 Abnormal findings on diagnostic imaging of heart and coronary circulation: Secondary | ICD-10-CM

## 2022-05-25 DIAGNOSIS — I2510 Atherosclerotic heart disease of native coronary artery without angina pectoris: Secondary | ICD-10-CM

## 2022-05-25 MED ORDER — METOPROLOL TARTRATE 5 MG/5ML IV SOLN
INTRAVENOUS | Status: AC
  Administered 2022-05-25: 10 mg via INTRAVENOUS
  Filled 2022-05-25: qty 10

## 2022-05-25 MED ORDER — IOHEXOL 350 MG/ML SOLN
100.0000 mL | Freq: Once | INTRAVENOUS | Status: AC | PRN
  Administered 2022-05-25: 100 mL via INTRAVENOUS

## 2022-05-25 MED ORDER — NITROGLYCERIN 0.4 MG SL SUBL
0.8000 mg | SUBLINGUAL_TABLET | Freq: Once | SUBLINGUAL | Status: AC
  Administered 2022-05-25: 0.8 mg via SUBLINGUAL

## 2022-05-25 MED ORDER — NITROGLYCERIN 0.4 MG SL SUBL
SUBLINGUAL_TABLET | SUBLINGUAL | Status: AC
  Filled 2022-05-25: qty 2

## 2022-05-25 MED ORDER — METOPROLOL TARTRATE 5 MG/5ML IV SOLN: 10.0000 mg | INTRAVENOUS | Status: DC | PRN

## 2022-05-27 ENCOUNTER — Telehealth: Payer: Self-pay

## 2022-05-27 DIAGNOSIS — J849 Interstitial pulmonary disease, unspecified: Secondary | ICD-10-CM

## 2022-05-27 DIAGNOSIS — R931 Abnormal findings on diagnostic imaging of heart and coronary circulation: Secondary | ICD-10-CM

## 2022-05-27 DIAGNOSIS — E785 Hyperlipidemia, unspecified: Secondary | ICD-10-CM

## 2022-05-27 DIAGNOSIS — Z79899 Other long term (current) drug therapy: Secondary | ICD-10-CM

## 2022-05-27 NOTE — Telephone Encounter (Signed)
-----   Message from Meriam Sprague, MD sent at 05/25/2022  8:28 PM EST ----- His CT scan of his heart arteries shows moderate disease in his heart arteries but the flow is normal. His Ca score is elevated at 715 (89% for age, gender, race matched controls). This means we need to aggressively lower his cholesterol and start aspirin to prevent the plaque from progressing. Can we start him on crestor 20mg  daily in addition to ASA 81mg  daily. Check lipids in 8 weeks.  He was incidentally noted to have some lung changes concerning for possible interstitial lung disease. Can we get the ILD protocol CT chest and refer him to [facility] for further evaluation.

## 2022-05-27 NOTE — Telephone Encounter (Signed)
Sent patient a Clinical cytogeneticist message of results. Will place orders, so when patient calls back his test can be scheduled.

## 2022-05-30 MED ORDER — ASPIRIN 81 MG PO TBEC: 81.0000 mg | DELAYED_RELEASE_TABLET | Freq: Every day | ORAL | 3 refills | Status: AC

## 2022-05-30 MED ORDER — ROSUVASTATIN CALCIUM 20 MG PO TABS: 20.0000 mg | ORAL_TABLET | Freq: Every day | ORAL | 2 refills | Status: AC

## 2022-05-30 NOTE — Telephone Encounter (Signed)
Called the pts wife and she stated to call him between morning hours until around noon, for he reports to work each day around 1 pm.   Will endorse this to our [facility] scheduling team, to that they can call him back during those hours to arrange needed appts.

## 2022-05-30 NOTE — Telephone Encounter (Signed)
Patient was returning call. Please advise ?

## 2022-05-30 NOTE — Telephone Encounter (Signed)
Left the pt a message to call back as needed.  Results and plan also sent to his mychart account for review.  [facility] to call and arrange all his appts.

## 2022-05-30 NOTE — Telephone Encounter (Signed)
Order for Chest CT ILD protocol, referral to Pulmonology placed by triage nurse Antonietta Breach RN.   Crestor 20 mg po daily sent to the pts pharmacy on file.   Order for repeat lipids placed in the system to be done in 8 weeks as well.   Will have [facility] Scheduling to call the pt to arrange all appts needed.   Pt did review results and plan per Dr. Shari Prows, in his active mychart account.

## 2022-05-31 ENCOUNTER — Telehealth: Payer: Self-pay | Admitting: *Deleted

## 2022-05-31 NOTE — Telephone Encounter (Signed)
Pts Pulmonology consult appt is scheduled for 12/6 at 1030 with Dr. Vassie Loll.  Pt made aware of appt date and time by [facility] Scheduler.

## 2022-05-31 NOTE — Telephone Encounter (Signed)
Pts Chest CT is scheduled for 06/23/22 at 1100.  Pts lab appt to check lipids is scheduled for 07/25/22.  Pt made aware of appt dates and times by [facility] Scheduling dept.   Referral to Pulmonology recently placed, and [facility] Scheduling will help coordinate an appt with Pulmonology office, for this pt.

## 2022-05-31 NOTE — Telephone Encounter (Signed)
-----   Message from Pricilla Holm sent at 05/31/2022 12:38 PM EST ----- Regarding: RE: NEEDS SEVERAL APPTS PER DR. Shari Prows Ct 12-14 @ [facility]  / Lab   1/15/  Pulm  12/6 with Vassie Loll @ [facility]  ----- Message ----- From: Loa Socks, LPN Sent: 83/38/2505   9:43 AM EST To: Pricilla Holm; [facility] Subject: NEEDS SEVERAL APPTS PER DR. Shari Prows          Dr. Shari Prows ordered for this pt to have several things scheduled:  1.)  needs a CHEST CT done ILD protocol --please schedule-order is in there  2.)  needs lipids checked in 8 weeks here in the office--order there  3.)  needs to be referred to Pulmonology -referral there   Can you please call the pt and schedule all appts and shoot me the dates thereafter?   Thanks [employer]

## 2022-06-15 ENCOUNTER — Other Ambulatory Visit (HOSPITAL_BASED_OUTPATIENT_CLINIC_OR_DEPARTMENT_OTHER): Payer: Self-pay

## 2022-06-15 ENCOUNTER — Encounter (HOSPITAL_BASED_OUTPATIENT_CLINIC_OR_DEPARTMENT_OTHER): Payer: Self-pay | Admitting: Pulmonary Disease

## 2022-06-15 ENCOUNTER — Ambulatory Visit (INDEPENDENT_AMBULATORY_CARE_PROVIDER_SITE_OTHER): Payer: 59 | Admitting: Pulmonary Disease

## 2022-06-15 VITALS — BP 136/84 | HR 71 | Temp 98.1°F | Ht 70.0 in | Wt 232.8 lb

## 2022-06-15 DIAGNOSIS — J45909 Unspecified asthma, uncomplicated: Secondary | ICD-10-CM | POA: Diagnosis not present

## 2022-06-15 DIAGNOSIS — J849 Interstitial pulmonary disease, unspecified: Secondary | ICD-10-CM | POA: Diagnosis not present

## 2022-06-15 LAB — PULMONARY FUNCTION TEST
DL/VA % pred: 113 %
DL/VA: 4.64 ml/min/mmHg/L
DLCO cor % pred: 78 %
DLCO cor: 23.78 ml/min/mmHg
DLCO unc % pred: 78 %
DLCO unc: 23.78 ml/min/mmHg
FEF 25-75 Pre: 3.59 L/sec
FEF2575-%Pred-Pre: 113 %
FEV1-%Pred-Pre: 88 %
FEV1-Pre: 3.53 L
FEV1FVC-%Pred-Pre: 109 %
FEV6-%Pred-Pre: 84 %
FEV6-Pre: 4.3 L
FEV6FVC-%Pred-Pre: 104 %
FVC-%Pred-Pre: 80 %
FVC-Pre: 4.31 L
Pre FEV1/FVC ratio: 82 %
Pre FEV6/FVC Ratio: 100 %
RV % pred: 75 %
RV: 1.92 L
TLC % pred: 73 %
TLC: 5.76 L

## 2022-06-15 MED ORDER — AZITHROMYCIN 250 MG PO TABS: 250.0000 mg | ORAL_TABLET | Freq: Every day | ORAL | 0 refills | Status: DC

## 2022-06-15 NOTE — Progress Notes (Signed)
Subjective:    Patient ID: Bobby Morrow, male    DOB: 1956-06-09, 66 y.o.   MRN: [id number]  HPI   Chief Complaint  Patient presents with   Consult    Pt states he went to see PCP for yearly. Pt states that the CT Scan showed that he may have ILD. Pt states he has had some SOB with exertion x6 months.   66 year old never smoker presents for evaluation of dyspnea on exertion. He reports dyspnea on walking to his mailbox and back a distance of 150 feet since summer gradually worsening.  He contracted a lung infection from his daughter and grandson who are sick, tested negative for RSV.  He reports cough with yellow sputum production for about a month which is gradually improving.  He denies wheezing or fevers. His PCP thought he had a murmur and referred him for cardiology evaluation.  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  He was referred to [place].  PMH -asthma diagnosed 25 years ago, uses albuterol MDI during spring and fall Hypertension, hyperlipidemia COVID infection 2021 -not hospitalized, required 5 weeks to improve.  Environment -lives with his wife in a break home built in [place], has crawlspace, hardwoods and carpets, no mold exposure. 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 No pertinent surgical history.  Allergies  Allergen Reactions   Ciprofloxacin Other (See Comments)    BLE swelling   Amlodipine    Sulfa Antibiotics Rash     Social History   Socioeconomic History   Marital status: Married    Spouse name: Not on file   Number of children: Not on file   Years of education: Not on file   Highest education level: Not on file  Occupational History   Not on file  Tobacco Use   Smoking status: Never    Passive exposure: Never   Smokeless tobacco: Never  Substance and Sexual Activity   Alcohol use: Yes    Comment: occasionally   Drug use: Never   Sexual activity: Not Currently  Other Topics Concern   Not on file  Social History Narrative   Not on file   Social Determinants of Health   Financial Resource Strain: Not on file  Food Insecurity: Not on file  Transportation Needs: Not on file  Physical Activity: Not on file  Stress: Not on file  Social Connections: Not on file  Intimate Partner Violence: Not on file    History reviewed. No pertinent family history. Daughter has allergies  Review of Systems Constitutional: negative for anorexia, fevers and sweats  Eyes: negative for irritation, redness and visual disturbance  Ears, nose, mouth, throat, and face: negative for earaches, epistaxis, nasal congestion and sore throat   Cardiovascular: negative for chest pain, dyspnea, lower extremity edema, orthopnea, palpitations and syncope  Gastrointestinal: negative for abdominal  pain, constipation, diarrhea, melena, nausea and vomiting  Genitourinary:negative for dysuria, frequency and hematuria  Hematologic/lymphatic: negative for bleeding, easy bruising and lymphadenopathy  Musculoskeletal:negative for arthralgias, muscle weakness and stiff joints  Neurological: negative for coordination problems, gait problems, headaches and weakness  Endocrine: negative for diabetic symptoms including polydipsia, polyuria and weight loss     Objective:   Physical Exam  Gen. Pleasant, well-nourished, in no distress, normal affect ENT - no pallor,icterus, no post nasal drip Neck: No JVD, no thyromegaly,  no carotid bruits Lungs: no use of accessory muscles, no dullness to percussion, right basal fine crackles Cardiovascular: Rhythm regular, heart sounds  normal, no murmurs or gallops, no peripheral edema Abdomen: soft and non-tender, no hepatosplenomegaly, BS normal. Musculoskeletal: No deformities, no cyanosis or clubbing Neuro:  alert, non focal       Assessment & Plan:

## 2022-06-15 NOTE — Progress Notes (Signed)
Full PFT Performed Today  

## 2022-06-15 NOTE — Assessment & Plan Note (Signed)
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  It appears to have a peripheral distribution but we will schedule high-resolution CT chest to see if he meets criteria for UIP.  He does not have any stigmata of collagen vascular disease or significant environmental exposure to suggest hypersensitivity pneumonitis.  He does not seem to have significant occupational exposure. We will obtain blood work including ANA, CCP and ACE level PFTs show mild intraparenchymal restriction  I explained to him the various categories of ILD today.  We are possibly looking at idiopathic pulmonary fibrosis here, based on further testing we can discuss whether there is a role for antifibrotic's

## 2022-06-15 NOTE — Patient Instructions (Signed)
X PFTs  X Blood work  X Change to HRCT chest for ILD  X Zpak

## 2022-06-15 NOTE — Patient Instructions (Signed)
Full PFT Performed Today  

## 2022-06-23 ENCOUNTER — Ambulatory Visit (HOSPITAL_COMMUNITY)
Admission: RE | Admit: 2022-06-23 | Discharge: 2022-06-23 | Disposition: A | Discharge status: Home / Self Care | Payer: 59 | Source: Ambulatory Visit | Attending: Pulmonary Disease | Admitting: Pulmonary Disease

## 2022-06-23 ENCOUNTER — Ambulatory Visit (HOSPITAL_COMMUNITY): Payer: 59

## 2022-06-23 DIAGNOSIS — J849 Interstitial pulmonary disease, unspecified: Secondary | ICD-10-CM | POA: Diagnosis present

## 2022-06-23 LAB — CBC WITH DIFFERENTIAL
Basophils Absolute: 0.1 10*3/uL (ref 0.0–0.2)
Basos: 1 %
EOS (ABSOLUTE): 0.3 10*3/uL (ref 0.0–0.4)
Eos: 4 %
Hematocrit: 43.8 % (ref 37.5–51.0)
Hemoglobin: 15.2 g/dL (ref 13.0–17.7)
Immature Grans (Abs): 0 10*3/uL (ref 0.0–0.1)
Immature Granulocytes: 0 %
Lymphocytes Absolute: 1.3 10*3/uL (ref 0.7–3.1)
Lymphs: 16 %
MCH: 32.1 pg (ref 26.6–33.0)
MCHC: 34.7 g/dL (ref 31.5–35.7)
MCV: 93 fL (ref 79–97)
Monocytes Absolute: 0.7 10*3/uL (ref 0.1–0.9)
Monocytes: 9 %
Neutrophils Absolute: 5.4 10*3/uL (ref 1.4–7.0)
Neutrophils: 70 %
RBC: 4.73 x10E6/uL (ref 4.14–5.80)
RDW: 11.4 % — ABNORMAL LOW (ref 11.6–15.4)
WBC: 7.8 10*3/uL (ref 3.4–10.8)

## 2022-06-29 ENCOUNTER — Other Ambulatory Visit: Payer: 59

## 2022-06-29 ENCOUNTER — Telehealth: Payer: Self-pay

## 2022-06-29 ENCOUNTER — Other Ambulatory Visit: Payer: Self-pay

## 2022-06-29 NOTE — Telephone Encounter (Signed)
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 Please schedule follow-up office visit with me next available to discuss medication. Please follow-up on his blood work which was supposed to be sent on 12/6 -ANA, CCP and ACE level has not resulted

## 2022-06-29 NOTE — Telephone Encounter (Signed)
Called Pt to ask if he had his blood work done and he stated yes but it took him a little over a week to get done because of Covid. Pt states that he has seen the results on his my chart and understood everything. Nothing further needed.

## 2022-07-13 ENCOUNTER — Ambulatory Visit (HOSPITAL_BASED_OUTPATIENT_CLINIC_OR_DEPARTMENT_OTHER): Payer: 59 | Admitting: Pulmonary Disease

## 2022-07-18 ENCOUNTER — Ambulatory Visit (INDEPENDENT_AMBULATORY_CARE_PROVIDER_SITE_OTHER): Payer: 59 | Admitting: Pulmonary Disease

## 2022-07-18 ENCOUNTER — Encounter (HOSPITAL_BASED_OUTPATIENT_CLINIC_OR_DEPARTMENT_OTHER): Payer: Self-pay | Admitting: Pulmonary Disease

## 2022-07-18 DIAGNOSIS — J849 Interstitial pulmonary disease, unspecified: Secondary | ICD-10-CM

## 2022-07-18 NOTE — Assessment & Plan Note (Signed)
I discussed implications of HRCT finding of "probable UIP".  I discussed diagnosis and natural history of idiopathic pulmonary fibrosis. I discussed antifibrotic's and their side effect profile.  He would like to hold off. Will obtain blood work to rule out other inflammatory etiology although I doubt this, he does not have any stigmata of collagen vascular disease. We will reassess him in 3 to 6 months

## 2022-07-18 NOTE — Patient Instructions (Signed)
  X Amb sat  X blood work today  We discussed anti fibrotic medication - OFEV  (diarrhea) & PERFENIDONE ( nausea)

## 2022-07-18 NOTE — Progress Notes (Signed)
   Subjective:    Patient ID: NYEEM STOKE, male    DOB: December 28, 1955, 67 y.o.   MRN: [id number]  HPI 67 year old never smoker for follow-up of recently diagnosed ILD Initial OV 06/2022  PMH -asthma diagnosed 25 years ago, uses albuterol MDI during spring and fall Hypertension, hyperlipidemia COVID infection 2021 -not hospitalized, required 5 weeks to improve.   Environment -lives with his wife in a break home built in [place], has crawlspace, hardwoods and carpets, no mold exposure. 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    Close family members had RSV infection in November .  He developed cough and shortness of breath. He is finally feeling a little bit better.  However he states that he gets tired with minimal exertion and in activities which he was previously able to reduce his work on his car. On ambulation his heart rate increased from 88-90 3:09 laps with oxygen saturation only dropped from 97 to 94%  We reviewed HRCT and PFTs today  Significant tests/ events reviewed  HRCT chest 06/2022 >> "probable UIP" , mild air trapping, pericardial cyst CT coronaries 05/2022 bilateral reticular opacities and traction bronchiectasis   PFTs 06/2022 no airway obstruction, FVC 80%, TLC 73%, DLCO 23.8/78%  Review of Systems neg for any significant sore throat, dysphagia, itching, sneezing, nasal congestion or excess/ purulent secretions, fever, chills, sweats, unintended wt loss, pleuritic or exertional cp, hempoptysis, orthopnea pnd or change in chronic leg swelling. Also denies presyncope, palpitations, heartburn, abdominal pain, nausea, vomiting, diarrhea or change in bowel or urinary habits, dysuria,hematuria, rash, arthralgias, visual complaints, headache, numbness weakness or ataxia.     Objective:   Physical Exam  Gen. Pleasant, well-nourished, in no  distress ENT - no thrush, no pallor/icterus,no post nasal drip Neck: No JVD, no thyromegaly, no carotid bruits Lungs: no use of accessory muscles, no dullness to percussion, clear without rales or rhonchi  Cardiovascular: Rhythm regular, heart sounds  normal, no murmurs or gallops, no peripheral edema Musculoskeletal: No deformities, no cyanosis or clubbing        Assessment & Plan:

## 2022-07-19 LAB — CYCLIC CITRUL PEPTIDE ANTIBODY, IGG/IGA: Cyclic Citrullin Peptide Ab: 5 units (ref 0–19)

## 2022-07-19 LAB — ANA+ENA+DNA/DS+SCL 70+SJOSSA/B
ANA Titer 1: NEGATIVE
ENA RNP Ab: 0.2 AI (ref 0.0–0.9)
ENA SM Ab Ser-aCnc: <0.2 AI (ref 0.0–0.9)
ENA SSA (RO) Ab: <0.2 AI (ref 0.0–0.9)
ENA SSB (LA) Ab: <0.2 AI (ref 0.0–0.9)
Scleroderma (Scl-70) (ENA) Antibody, IgG: <0.2 AI (ref 0.0–0.9)
dsDNA Ab: <1 IU/mL (ref 0–9)

## 2022-07-19 LAB — ANA: Anti Nuclear Antibody (ANA): NEGATIVE

## 2022-07-19 LAB — CK: Total CK: 165 U/L (ref 41–331)

## 2022-07-21 ENCOUNTER — Telehealth (HOSPITAL_BASED_OUTPATIENT_CLINIC_OR_DEPARTMENT_OTHER): Payer: Self-pay

## 2022-07-21 NOTE — Telephone Encounter (Signed)
LMOM for Pt to review labs.

## 2022-07-21 NOTE — Telephone Encounter (Signed)
-----   Message from Rigoberto Noel, MD sent at 07/20/2022  7:56 AM EST ----- Inflammatory markers all negative

## 2022-07-22 NOTE — Telephone Encounter (Signed)
Spoke with Pt's wife,  per DPR, and gave her the results of his blood work. Pt's wife stated that she would let him know and I also confirmed that she had our phone number to te [facility] location. Nothing further needed at this time.

## 2022-07-25 ENCOUNTER — Ambulatory Visit: Payer: 59 | Attending: Cardiology

## 2022-07-25 DIAGNOSIS — E785 Hyperlipidemia, unspecified: Secondary | ICD-10-CM

## 2022-07-25 DIAGNOSIS — R931 Abnormal findings on diagnostic imaging of heart and coronary circulation: Secondary | ICD-10-CM

## 2022-07-25 DIAGNOSIS — J849 Interstitial pulmonary disease, unspecified: Secondary | ICD-10-CM

## 2022-07-25 LAB — HEPATIC FUNCTION PANEL
ALT: 33 IU/L (ref 0–44)
AST: 27 IU/L (ref 0–40)
Albumin: 4.4 g/dL (ref 3.9–4.9)
Alkaline Phosphatase: 89 IU/L (ref 44–121)
Bilirubin Total: 0.4 mg/dL (ref 0.0–1.2)
Bilirubin, Direct: 0.14 mg/dL (ref 0.00–0.40)
Total Protein: 6.3 g/dL (ref 6.0–8.5)

## 2022-07-25 LAB — LIPID PANEL
Chol/HDL Ratio: 2.8 ratio (ref 0.0–5.0)
Cholesterol, Total: 128 mg/dL (ref 100–199)
HDL: 45 mg/dL (ref >39)
LDL Chol Calc (NIH): 54 mg/dL (ref 0–99)
Triglycerides: 171 mg/dL — ABNORMAL HIGH (ref 0–149)
VLDL Cholesterol Cal: 29 mg/dL (ref 5–40)

## 2022-07-26 ENCOUNTER — Telehealth: Payer: Self-pay | Admitting: *Deleted

## 2022-07-26 DIAGNOSIS — E785 Hyperlipidemia, unspecified: Secondary | ICD-10-CM

## 2022-07-26 DIAGNOSIS — Z79899 Other long term (current) drug therapy: Secondary | ICD-10-CM

## 2022-07-26 DIAGNOSIS — R931 Abnormal findings on diagnostic imaging of heart and coronary circulation: Secondary | ICD-10-CM

## 2022-07-26 NOTE — Telephone Encounter (Signed)
-----  Message from Freada Bergeron, MD sent at 07/26/2022  4:13 PM EST ----- Cholesterol looks excellent. TG slightly above goal (Goal <150). Will continue to work on diet and lifestyle modifications. Can repeat lipids at follow-up appointment to ensure TG improved.

## 2022-07-26 NOTE — Telephone Encounter (Signed)
The patient and wife has been notified of the result and verbalized understanding.  All questions (if any) were answered.  He is aware to work on healthy lifestyle and diet.  Pt education provided to both about this.   He is aware that when he comes into see [place] for his yearly sometime in Oct 2024 (don't have Dr. Jacolyn Reedy schedule out that far yet), we will recheck his lipids same time at that visit.   They are aware that when we have the Oct schedule available for him to see Dr. Johney Frame, we will do lipids on him same day as this appt.  Will go ahead and place future lipids for around that time, and recall this.   Both verbalized understanding and agrees with this plan.

## 2022-07-27 ENCOUNTER — Other Ambulatory Visit: Payer: 59

## 2022-10-17 ENCOUNTER — Encounter (HOSPITAL_BASED_OUTPATIENT_CLINIC_OR_DEPARTMENT_OTHER): Payer: Self-pay | Admitting: Pulmonary Disease

## 2022-10-17 ENCOUNTER — Ambulatory Visit (INDEPENDENT_AMBULATORY_CARE_PROVIDER_SITE_OTHER): Payer: 59 | Admitting: Pulmonary Disease

## 2022-10-17 VITALS — BP 126/80 | HR 95 | Ht 74.0 in | Wt 233.6 lb

## 2022-10-17 DIAGNOSIS — J849 Interstitial pulmonary disease, unspecified: Secondary | ICD-10-CM | POA: Diagnosis not present

## 2022-10-17 NOTE — Patient Instructions (Signed)
x high-res CT chest in August 2024  Glad you are breathing better

## 2022-10-17 NOTE — Assessment & Plan Note (Signed)
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  He needs a follow-up imaging which can be obtained between 6 to 12 months.  Subjectively his breathing seems to have improved.  He would like to defer repeat high-resolution CT chest for 4 months and we will arrange. If this still shows probable UIP, then we will reassess with PFTs and have a discussion about antifibrotic's

## 2022-10-17 NOTE — Progress Notes (Signed)
   Subjective:    Patient ID: Bobby Morrow, male    DOB: 1956-01-10, 67 y.o.   MRN: [id number]  HPI  67 yo  never smoker for follow-up of  ILD Initial OV 06/2022   PMH -asthma diagnosed 25 years ago, uses albuterol MDI during spring and fall Hypertension, hyperlipidemia COVID infection 2021 -not hospitalized, required 5 weeks to improve.     Environment -lives with his wife in a brick home built in [place], has crawlspace, hardwoods and carpets, no mold exposure. 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  He reports his anxiety is decreased since he decreased his Synthroid dose from 1 37-1 25.  He also is practicing some pursed lip breathing. His weight is unchanged.  He can walk in the park for 45 minutes but occasionally gets short of breath when he bends over or squats down.  Significant tests/ events reviewed  Serology 07/2022 neg  HRCT chest 06/2022 >> "probable UIP" , mild air trapping, pericardial cyst CT coronaries 05/2022 bilateral reticular opacities and traction bronchiectasis   PFTs 06/2022 no airway obstruction, FVC 80%, TLC 73%, DLCO 23.8/78%  Review of Systems neg for any significant sore throat, dysphagia, itching, sneezing, nasal congestion or excess/ purulent secretions, fever, chills, sweats, unintended wt loss, pleuritic or exertional cp, hempoptysis, orthopnea pnd or change in chronic leg swelling. Also denies presyncope, palpitations, heartburn, abdominal pain, nausea, vomiting, diarrhea or change in bowel or urinary habits, dysuria,hematuria, rash, arthralgias, visual complaints, headache, numbness weakness or ataxia.     Objective:   Physical Exam  Gen. Pleasant, obese, in no distress ENT - no lesions, no post nasal drip Neck: No JVD, no  thyromegaly, no carotid bruits Lungs: no use of accessory muscles, no dullness to percussion, decreased without rales or rhonchi  Cardiovascular: Rhythm regular, heart sounds  normal, no murmurs or gallops, no peripheral edema Musculoskeletal: No deformities, no cyanosis or clubbing , no tremors       Assessment & Plan:

## 2023-02-08 ENCOUNTER — Other Ambulatory Visit: Payer: Self-pay

## 2023-02-08 DIAGNOSIS — R072 Precordial pain: Secondary | ICD-10-CM

## 2023-02-08 MED ORDER — VALSARTAN 160 MG PO TABS: 160.0000 mg | ORAL_TABLET | Freq: Every day | ORAL | 0 refills | Status: DC

## 2023-02-18 ENCOUNTER — Ambulatory Visit (HOSPITAL_BASED_OUTPATIENT_CLINIC_OR_DEPARTMENT_OTHER): Admission: RE | Admit: 2023-02-18 | Payer: 59 | Source: Ambulatory Visit

## 2023-02-18 DIAGNOSIS — J849 Interstitial pulmonary disease, unspecified: Secondary | ICD-10-CM | POA: Insufficient documentation

## 2023-05-25 ENCOUNTER — Other Ambulatory Visit: Payer: Self-pay

## 2023-05-25 ENCOUNTER — Other Ambulatory Visit (HOSPITAL_BASED_OUTPATIENT_CLINIC_OR_DEPARTMENT_OTHER): Payer: Self-pay

## 2023-05-25 DIAGNOSIS — R072 Precordial pain: Secondary | ICD-10-CM

## 2023-05-25 DIAGNOSIS — J849 Interstitial pulmonary disease, unspecified: Secondary | ICD-10-CM

## 2023-05-25 MED ORDER — VALSARTAN 160 MG PO TABS: 160.0000 mg | ORAL_TABLET | Freq: Every day | ORAL | 0 refills | Status: AC

## 2023-05-26 ENCOUNTER — Ambulatory Visit (HOSPITAL_BASED_OUTPATIENT_CLINIC_OR_DEPARTMENT_OTHER): Payer: 59 | Admitting: Pulmonary Disease

## 2023-05-26 DIAGNOSIS — J849 Interstitial pulmonary disease, unspecified: Secondary | ICD-10-CM

## 2023-05-26 LAB — PULMONARY FUNCTION TEST
DL/VA % pred: 112 %
DL/VA: 4.58 ml/min/mmHg/L
DLCO cor % pred: 84 %
DLCO cor: 24.67 ml/min/mmHg
DLCO unc % pred: 84 %
DLCO unc: 24.67 ml/min/mmHg
FEF 25-75 Post: 4.65 L/s
FEF 25-75 Pre: 3.81 L/s
FEF2575-%Change-Post: 22 %
FEF2575-%Pred-Post: 154 %
FEF2575-%Pred-Pre: 126 %
FEV1-%Change-Post: 4 %
FEV1-%Pred-Post: 95 %
FEV1-%Pred-Pre: 91 %
FEV1-Post: 3.64 L
FEV1-Pre: 3.49 L
FEV1FVC-%Change-Post: 1 %
FEV1FVC-%Pred-Pre: 113 %
FEV6-%Change-Post: 2 %
FEV6-%Pred-Post: 86 %
FEV6-%Pred-Pre: 84 %
FEV6-Post: 4.22 L
FEV6-Pre: 4.1 L
FEV6FVC-%Pred-Post: 105 %
FEV6FVC-%Pred-Pre: 105 %
FVC-%Change-Post: 2 %
FVC-%Pred-Post: 82 %
FVC-%Pred-Pre: 80 %
FVC-Post: 4.22 L
FVC-Pre: 4.1 L
Post FEV1/FVC ratio: 86 %
Post FEV6/FVC ratio: 100 %
Pre FEV1/FVC ratio: 85 %
Pre FEV6/FVC Ratio: 100 %
RV % pred: 67 %
RV: 1.68 L
TLC % pred: 77 %
TLC: 5.87 L

## 2023-05-26 NOTE — Progress Notes (Signed)
Full PFT performed Today 

## 2023-05-26 NOTE — Patient Instructions (Signed)
Full PFT performed Today 

## 2023-05-29 ENCOUNTER — Encounter: Payer: Self-pay | Admitting: Cardiology

## 2023-05-29 ENCOUNTER — Ambulatory Visit: Payer: 59 | Attending: Cardiology | Admitting: Cardiology

## 2023-05-29 ENCOUNTER — Other Ambulatory Visit: Payer: Self-pay | Admitting: Cardiology

## 2023-05-29 ENCOUNTER — Ambulatory Visit: Payer: 59

## 2023-05-29 VITALS — BP 162/102 | HR 73 | Ht 74.0 in | Wt 226.0 lb

## 2023-05-29 DIAGNOSIS — Z79899 Other long term (current) drug therapy: Secondary | ICD-10-CM

## 2023-05-29 DIAGNOSIS — R931 Abnormal findings on diagnostic imaging of heart and coronary circulation: Secondary | ICD-10-CM | POA: Diagnosis not present

## 2023-05-29 DIAGNOSIS — E785 Hyperlipidemia, unspecified: Secondary | ICD-10-CM

## 2023-05-29 DIAGNOSIS — J849 Interstitial pulmonary disease, unspecified: Secondary | ICD-10-CM | POA: Diagnosis not present

## 2023-05-29 LAB — LIPID PANEL
Chol/HDL Ratio: 5.3 ratio — ABNORMAL HIGH (ref 0.0–5.0)
Cholesterol, Total: 189 mg/dL (ref 100–199)
HDL: 36 mg/dL — ABNORMAL LOW (ref >39)
LDL Chol Calc (NIH): 107 mg/dL — ABNORMAL HIGH (ref 0–99)
Triglycerides: 267 mg/dL — ABNORMAL HIGH (ref 0–149)
VLDL Cholesterol Cal: 46 mg/dL — ABNORMAL HIGH (ref 5–40)

## 2023-05-29 NOTE — Patient Instructions (Signed)
Medication Instructions:  The current medical regimen is effective;  continue present plan and medications.  *If you need a refill on your cardiac medications before your next appointment, please call your pharmacy*  Follow-Up: At [facility], you and your health needs are our priority.  As part of our continuing mission to provide you with exceptional heart care, we have created designated Provider Care Teams.  These Care Teams include your primary Cardiologist (physician) and Advanced Practice Providers (APPs -  Physician Assistants and Nurse Practitioners) who all work together to provide you with the care you need, when you need it.  We recommend signing up for the patient portal called "MyChart".  Sign up information is provided on this After Visit Summary.  MyChart is used to connect with patients for Virtual Visits (Telemedicine).  Patients are able to view lab/test results, encounter notes, upcoming appointments, etc.  Non-urgent messages can be sent to your provider as well.   To learn more about what you can do with MyChart, go to [email or web address].    Your next appointment:   Follow up as needed with Dr Skains   

## 2023-05-29 NOTE — Progress Notes (Signed)
Cardiology Office Note:  .   Date:  05/29/2023  ID:  Bobby Morrow, DOB [birth year], MRN [id number] PCP: Joycelyn Rua, MD  [facility] Providers Cardiologist:  None     History of Present Illness: Bobby Morrow Kitchen   Bobby Morrow is a 67 y.o. male Discussed with the use of AI scribe   History of Present Illness   The 67 year old patient with a history of hypertension, managed with Valsartan 160mg  daily, presented for a follow-up visit. He had previously experienced dyspnea on exertion, with an echocardiogram revealing an EF of 55-60% and grade one diastolic dysfunction. A coronary CT scan performed showed moderate disease of the coronary arteries with a calcium score of 715. Consequently, he was started on Crestor 20mg  and aspirin 81mg . Incidental findings suggested interstitial lung disease, confirmed by a dedicated lung CT, which showed a parenchymal pattern of interstitial lung disease and an enlarged pulmonary trunk indicative of pulmonary arterial hypertension.  The patient reported feeling good overall, with no shortness of breath. However, he noted a significant decrease in energy levels, being able to sustain activity for only three to four hours compared to his previous twelve. He attributed this to both physical tiredness and a lack of drive. He denied any current use of rosuvastatin, having stopped due to adverse effects including difficulty with stairs and significant leg pain. These symptoms resolved upon discontinuation of the medication.  The patient also reported running out of Valsartan prior to the visit and had not been able to refill it. He has "given up" he says on checking BP at home.  He expressed a desire to delay further cholesterol treatment until after retirement in about a year and a half. Despite the cessation of Valsartan, he reported no exacerbation of symptoms or discomfort.     Works at [employer].        Studies Reviewed: Bobby Morrow Kitchen   EKG  Interpretation Date/Time:  Monday May 29 2023 13:19:45 EST Ventricular Rate:  73 PR Interval:  166 QRS Duration:  90 QT Interval:  430 QTC Calculation: 473 R Axis:   -41  Text Interpretation: Normal sinus rhythm Left axis deviation When compared with ECG of 17-Jun-2020 11:27, No significant change since last tracing Confirmed by Donato Schultz ([id number]) on 05/29/2023 1:23:49 PM    Results LABS LDL: 109 Triglycerides: 173 Hemoglobin: 15.1 Creatinine: 1.0  RADIOLOGY Coronary CT: Moderate disease of coronary arteries, calcium score 715 (05/25/2022) Lung CT: Parenchymal pattern of interstitial lung disease, enlarged pulmonary trunk indicative of pulmonary arterial hypertension  DIAGNOSTIC Echocardiogram: EF 55-60%, no significant valvular disease, grade 1 diastolic dysfunction  Risk Assessment/Calculations:           Physical Exam:   VS:  BP (!) 162/102   Pulse 73   Ht 6\' 2"  (1.88 m)   Wt 226 lb (102.5 kg)   SpO2 95%   BMI 29.02 kg/m    Wt Readings from Last 3 Encounters:  05/29/23 226 lb (102.5 kg)  05/26/23 225 lb 9.6 oz (102.3 kg)  10/17/22 233 lb 9.6 oz (106 kg)    GEN: Well nourished, well developed in no acute distress NECK: No JVD; No carotid bruits CARDIAC: RRR, no murmurs, no rubs, no gallops RESPIRATORY:  Clear to auscultation without rales, wheezing or rhonchi  ABDOMEN: Soft, non-tender, non-distended EXTREMITIES:  No edema; No deformity   ASSESSMENT AND PLAN: .    Assessment and Plan    Coronary Artery Disease Moderate disease with a calcium score of 715.  Discontinued rosuvastatin due to severe myalgia. LDL 109, triglycerides 173.  With statin intolerance, encourage alternatives.  Discussed risks of untreated hyperlipidemia, including myocardial infarction and stroke. Non-statin options include ezetimibe and PCSK9 inhibitors. - Continue aspirin 81 mg daily - Discuss non-statin alternatives with primary care physician, not interested at this time  -  Encourage diet and exercise  Hypertension Elevated blood pressure due to lapse in valsartan refill. Risks of uncontrolled hypertension include stroke, myocardial infarction, and kidney damage. - Refer to primary care physician for blood pressure management and valsartan refill  Interstitial Lung Disease Parenchymal pattern and enlarged pulmonary trunk indicative of pulmonary arterial hypertension. No change since last year. Follow-up with pulmonologist scheduled. - Continue follow-up with pulmonologist  General Health Maintenance Non-smoker with good health maintenance. Emphasized diet and exercise for managing coronary artery disease and hypertension. - Encourage diet and regular exercise  Follow-up - Schedule appointment with primary care physician.               Signed, Donato Schultz, MD

## 2023-06-02 ENCOUNTER — Ambulatory Visit (HOSPITAL_BASED_OUTPATIENT_CLINIC_OR_DEPARTMENT_OTHER): Payer: 59 | Admitting: Pulmonary Disease

## 2023-06-02 ENCOUNTER — Encounter (HOSPITAL_BASED_OUTPATIENT_CLINIC_OR_DEPARTMENT_OTHER): Payer: Self-pay | Admitting: Pulmonary Disease

## 2023-06-02 VITALS — BP 120/82 | HR 75 | Ht 74.0 in | Wt 227.0 lb

## 2023-06-02 DIAGNOSIS — J849 Interstitial pulmonary disease, unspecified: Secondary | ICD-10-CM

## 2023-06-02 NOTE — Assessment & Plan Note (Signed)
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  PFTs are reassuring, FVC and TLC are maintained and DLCO was in fact increased.  This may be related to normal variation but at least indicates stability. This may still be UIP, the other possibility is that may be related to previous COVID infection or other viral infection.  Serology is negative so unlikely to be connective tissue disorder. Will follow serially and consider antifibrotic's only for disease progression

## 2023-06-02 NOTE — Patient Instructions (Addendum)
You have fibrosis which is stable  X Repeat HRCT chest in aug 2025  RSV shot recommended

## 2023-06-02 NOTE — Progress Notes (Signed)
   Subjective:    Patient ID: Bobby Morrow, male    DOB: 12-20-1955, 67 y.o.   MRN: [id number]  HPI  67 yo  never smoker for follow-up of  ILD Initial OV 06/2022 - HRCT showed probable UIP pattern in December this was in the setting of multiple family members being sick with possible RSV infection    PMH -asthma diagnosed 25 years ago, uses albuterol MDI during spring and fall Hypertension, hyperlipidemia COVID infection 2021 -not hospitalized, required 5 weeks to improve.     Environment -lives with his wife in a brick home built in [place], has crawlspace, hardwoods and carpets, no mold exposure. 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 He states breathing has improved.  He is able to carry out activities without significant shortness of breath.  He denies cough. No skin rash or arthritis. We reviewed CT and PFTs today  Significant tests/ events reviewed  HRCT 02/2023 fibrotic NSIP vs UIP  Serology 07/2022 neg  HRCT chest 06/2022 >> "probable UIP" , mild air trapping, pericardial cyst CT coronaries 05/2022 bilateral reticular opacities and traction bronchiectasis   PFTs 05/2023, FVC 80%, TLC 77%, DLCO 24.7/84% PFTs 06/2022 no airway obstruction, FVC 80%, TLC 73%, DLCO 23.8/78%  Review of Systems neg for any significant sore throat, dysphagia, itching, sneezing, nasal congestion or excess/ purulent secretions, fever, chills, sweats, unintended wt loss, pleuritic or exertional cp, hempoptysis, orthopnea pnd or change in chronic leg swelling. Also denies presyncope, palpitations, heartburn, abdominal pain, nausea, vomiting, diarrhea or change in bowel or urinary habits, dysuria,hematuria, rash, arthralgias, visual complaints, headache, numbness weakness or ataxia.     Objective:   Physical Exam  Gen. Pleasant, well-nourished, in no distress ENT - no  thrush, no pallor/icterus,no post nasal drip Neck: No JVD, no thyromegaly, no carotid bruits Lungs: no use of accessory muscles, no dullness to percussion, right basal dry crackles Cardiovascular: Rhythm regular, heart sounds  normal, no murmurs or gallops, no peripheral edema Musculoskeletal: No deformities, no cyanosis or clubbing        Assessment & Plan:

## 2023-06-06 ENCOUNTER — Other Ambulatory Visit: Payer: Self-pay | Admitting: *Deleted

## 2023-06-06 DIAGNOSIS — E785 Hyperlipidemia, unspecified: Secondary | ICD-10-CM

## 2024-04-04 ENCOUNTER — Encounter (HOSPITAL_BASED_OUTPATIENT_CLINIC_OR_DEPARTMENT_OTHER): Payer: Self-pay | Admitting: Pulmonary Disease

## 2024-04-04 ENCOUNTER — Ambulatory Visit (HOSPITAL_BASED_OUTPATIENT_CLINIC_OR_DEPARTMENT_OTHER): Admitting: Pulmonary Disease

## 2024-04-04 VITALS — BP 135/89 | HR 67 | Ht 74.0 in | Wt 229.2 lb

## 2024-04-04 DIAGNOSIS — J849 Interstitial pulmonary disease, unspecified: Secondary | ICD-10-CM | POA: Diagnosis not present

## 2024-04-04 NOTE — Patient Instructions (Signed)
  VISIT SUMMARY: You came in today for a follow-up on your interstitial lung disease. You have a history of lung scarring identified during a heart scan, and you have had COVID-19 twice. Currently, you are not experiencing any breathing problems and feel better than you have in the past five years. Your lung capacity and diffusion capacity are stable.  YOUR PLAN: -INTERSTITIAL LUNG DISEASE WITH PULMONARY FIBROSIS: Interstitial lung disease with pulmonary fibrosis is a condition where the lung tissue becomes scarred and stiff, making it difficult to breathe. Your previous imaging did not fit a specific pattern, so it is unclear if the fibrosis is related to COVID-19 or another cause. Your lung capacity is at 80% and diffusion capacity at 84%, both of which are stable. We will order a high-resolution CT chest scan within the next four weeks and schedule annual scans to monitor any progression.  If the upcoming scan is stable, we will continue with yearly follow-ups. If the scan shows changes, we may need to investigate further.  INSTRUCTIONS: Please schedule a high-resolution CT chest scan within the next four weeks. If your upcoming scan is stable, we will continue with yearly follow-ups. If there are any changes in the scan, we may need to conduct further investigations.                      Contains text generated by [employer].                                 Contains text generated by [employer].

## 2024-04-04 NOTE — Progress Notes (Signed)
 Subjective:    Patient ID: Bobby Morrow, male    DOB: 1955-12-12, 68 y.o.   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     Environment -lives with his wife in a brick home built in [place], has crawlspace, hardwoods and carpets, no mold exposure. 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  History of Present Illness Bobby Morrow is a 68 year old male with interstitial lung disease who presents for follow-up. He is accompanied by his wife.  He has lung scarring identified during a heart scan, which led to further lung evaluation. He has had COVID-19 twice, with a prolonged recovery from the first instance in 2021. Currently, he has no breathing problems and feels better than in the past five years. His lung capacity was previously measured at 80%, with a diffusion capacity of 84%. He experiences some coughing but otherwise feels well. He received a flu shot two weeks ago. His last lung scan was in August of the previous year.    Significant tests/ events reviewed   HRCT 02/2023 fibrotic NSIP vs UIP  Serology 07/2022 neg  HRCT chest 06/2022 >> probable UIP , mild air trapping, pericardial cyst CT coronaries 05/2022 bilateral reticular opacities and traction bronchiectasis     PFTs 05/2023, FVC 80%, TLC 77%, DLCO 24.7/84% PFTs 06/2022 no airway obstruction, FVC 80%, TLC 73%, DLCO 23.8/78%   Review of  Systems  neg for any significant sore throat, dysphagia, itching, sneezing, nasal congestion or excess/ purulent secretions, fever, chills, sweats, unintended wt loss, pleuritic or exertional cp, hempoptysis, orthopnea pnd or change in chronic leg swelling. Also denies presyncope, palpitations, heartburn, abdominal pain, nausea, vomiting, diarrhea or change in bowel or urinary habits, dysuria,hematuria, rash, arthralgias, visual complaints, headache, numbness weakness or ataxia.      Objective:   Physical Exam Gen. Pleasant, well-nourished, in no distress ENT - no thrush, no pallor/icterus,no post nasal drip Neck: No JVD, no thyromegaly, no carotid bruits Lungs: no use of accessory muscles, no dullness to percussion, clear without rales or rhonchi  Cardiovascular: Rhythm regular, heart sounds  normal, no murmurs or gallops, no peripheral edema Musculoskeletal: No deformities, no cyanosis or clubbing         Assessment & Plan:   Assessment and Plan Assessment & Plan Interstitial lung disease  Previous imaging did not fit a specific pattern, creating uncertainty about whether the fibrosis is COVID-related or due to another cause 9IPF). Lung capacity is at 80% and diffusion capacity at 84%, both stable. COVID-related damage should not worsen over time, whereas other types might. Initial imaging suggested fibrosis, but subsequent scans did not confirm this, leading to a wait-and-watch approach. If there is no progression over four to five years, it may indicate damage from a past event although IPF remains in the differential. Anti fibrotics deferred due to good lung function & pt preference -  Order high-resolution CT chest within the next four weeks - Schedule annual scans to monitor progression - Conduct a check-in in six months - If the upcoming scan is stable, continue with yearly follow-up - If the scan shows changes, consider further investigation

## 2024-05-03 ENCOUNTER — Ambulatory Visit (HOSPITAL_BASED_OUTPATIENT_CLINIC_OR_DEPARTMENT_OTHER)
Admission: RE | Admit: 2024-05-03 | Discharge: 2024-05-03 | Disposition: A | Discharge status: Home / Self Care | Source: Ambulatory Visit | Attending: Pulmonary Disease | Admitting: Pulmonary Disease

## 2024-05-03 DIAGNOSIS — J849 Interstitial pulmonary disease, unspecified: Secondary | ICD-10-CM | POA: Diagnosis present

## 2024-05-06 ENCOUNTER — Ambulatory Visit: Payer: Self-pay | Admitting: Pulmonary Disease

## 2024-05-06 NOTE — Telephone Encounter (Signed)
 Can you call and schedule him for repeat PFTS and OV

## 2024-05-10 ENCOUNTER — Other Ambulatory Visit (HOSPITAL_BASED_OUTPATIENT_CLINIC_OR_DEPARTMENT_OTHER): Payer: Self-pay

## 2024-05-10 DIAGNOSIS — R0602 Shortness of breath: Secondary | ICD-10-CM

## 2024-05-14 ENCOUNTER — Ambulatory Visit (INDEPENDENT_AMBULATORY_CARE_PROVIDER_SITE_OTHER)

## 2024-05-14 ENCOUNTER — Ambulatory Visit: Payer: Self-pay | Admitting: Pulmonary Disease

## 2024-05-14 DIAGNOSIS — R0602 Shortness of breath: Secondary | ICD-10-CM | POA: Diagnosis not present

## 2024-05-14 LAB — PULMONARY FUNCTION TEST
DL/VA % pred: 114 %
DL/VA: 4.67 ml/min/mmHg/L
DLCO unc % pred: 85 %
DLCO unc: 25.02 ml/min/mmHg
FEF 25-75 Post: 4.54 L/s
FEF 25-75 Pre: 3.52 L/s
FEF2575-%Change-Post: 28 %
FEF2575-%Pred-Post: 153 %
FEF2575-%Pred-Pre: 119 %
FEV1-%Change-Post: 6 %
FEV1-%Pred-Post: 96 %
FEV1-%Pred-Pre: 90 %
FEV1-Post: 3.64 L
FEV1-Pre: 3.43 L
FEV1FVC-%Change-Post: 3 %
FEV1FVC-%Pred-Pre: 111 %
FEV6-%Change-Post: 3 %
FEV6-%Pred-Post: 88 %
FEV6-%Pred-Pre: 85 %
FEV6-Post: 4.26 L
FEV6-Pre: 4.12 L
FEV6FVC-%Pred-Post: 105 %
FEV6FVC-%Pred-Pre: 105 %
FVC-%Change-Post: 2 %
FVC-%Pred-Post: 83 %
FVC-%Pred-Pre: 81 %
FVC-Post: 4.26 L
FVC-Pre: 4.14 L
Post FEV1/FVC ratio: 85 %
Post FEV6/FVC ratio: 100 %
Pre FEV1/FVC ratio: 83 %
Pre FEV6/FVC Ratio: 100 %
RV % pred: 59 %
RV: 1.5 L
TLC % pred: 74 %
TLC: 5.71 L

## 2024-05-14 NOTE — Patient Instructions (Signed)
 Full PFT performed today.

## 2024-05-14 NOTE — Progress Notes (Signed)
 Full PFT performed today.

## 2024-05-20 ENCOUNTER — Ambulatory Visit (INDEPENDENT_AMBULATORY_CARE_PROVIDER_SITE_OTHER): Admitting: Pulmonary Disease

## 2024-05-20 ENCOUNTER — Encounter (HOSPITAL_BASED_OUTPATIENT_CLINIC_OR_DEPARTMENT_OTHER): Payer: Self-pay | Admitting: Pulmonary Disease

## 2024-05-20 VITALS — BP 132/84 | HR 82 | Ht 74.0 in | Wt 226.2 lb

## 2024-05-20 DIAGNOSIS — J849 Interstitial pulmonary disease, unspecified: Secondary | ICD-10-CM | POA: Diagnosis not present

## 2024-05-20 NOTE — Progress Notes (Signed)
 Subjective:    Patient ID: Bobby Morrow, male    DOB: Jan 12, 1956, 68 y.o.   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     Environment -lives with his wife in a brick home built in [place], has crawlspace, hardwoods and carpets, no mold exposure. 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  History of Present Illness Bobby Morrow is a 68 year old male with interstitial lung disease who presents for follow-up of mildly progressive pulmonary fibrosis.  He has a probable usual interstitial pneumonia pattern. A high-resolution CT scan in October shows mild progression compared to the previous year, but breathing tests remain stable.  He experiences no significant changes in symptoms and maintains full-time employment. He expresses concern about potential side effects of antifibrotic medications, such as diarrhea and photosensitivity, which may affect his work and lifestyle.  Pulmonary function tests indicate stable lung capacity and diffusion capacity, with results between 80-85%.  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     Significant tests/ events reviewed   HRCT 04/2024 >> probable UIP,  mildly progressive  HRCT 02/2023 fibrotic NSIP vs UIP  Serology 07/2022 neg  HRCT chest 06/2022 >>  probable UIP , mild air trapping, pericardial cyst CT coronaries 05/2022 bilateral reticular opacities and traction bronchiectasis     PFTs 05/2024 FVC at 80% & DLCO at 85% , stable PFTs 05/2023, FVC 80%, TLC 77%, DLCO 24.7/84% PFTs 06/2022 no airway obstruction, FVC 80%, TLC 73%, DLCO 23.8/78%    Review of Systems  neg for any significant sore throat, dysphagia, itching, sneezing, nasal congestion or excess/ purulent secretions, fever, chills, sweats, unintended wt loss, pleuritic or exertional cp, hempoptysis, orthopnea pnd or change in chronic leg swelling. Also denies presyncope, palpitations, heartburn, abdominal pain, nausea, vomiting, diarrhea or change in bowel or urinary habits, dysuria,hematuria, rash, arthralgias, visual complaints, headache, numbness weakness or ataxia.      Objective:   Physical Exam  Gen. 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 Differential diagnosis includes COVID fibrosis, but the progressive nature suggests fibrosis. Breathing tests are well-managed with lung capacity at 80-83% and diffusion at 85%. The condition is likely fibrosis with a 70-80% probability. Early initiation of antifibrotic medication is  beneficial to slow progression, though it does not reverse fibrosis. He is concerned about side effects, particularly photosensitivity and diarrhea,  and prefers to delay treatment until after retirement. - Will initiate antifibrotic medication after retirement in February. - Will start paperwork for medication approval in February. - Will schedule follow-up appointment in four months. - Monitor liver function monthly for the first three months, then every three months.

## 2024-05-20 NOTE — Patient Instructions (Signed)
 X Call me in Feb & we will start insurance approval for [facility]

## 2024-09-23 ENCOUNTER — Ambulatory Visit (HOSPITAL_BASED_OUTPATIENT_CLINIC_OR_DEPARTMENT_OTHER): Admitting: Pulmonary Disease
